# Patient Record
Sex: Female | Born: 1960 | Race: Black or African American | Hispanic: No | Marital: Married | Smoking: Never smoker
Health system: Southern US, Community
[De-identification: ages and names within clinical notes are randomized; demographics above are authoritative.]

## PROBLEM LIST (undated history)

## (undated) DIAGNOSIS — Z87898 Personal history of other specified conditions: Secondary | ICD-10-CM

## (undated) DIAGNOSIS — Z87440 Personal history of urinary (tract) infections: Secondary | ICD-10-CM

## (undated) DIAGNOSIS — D250 Submucous leiomyoma of uterus: Secondary | ICD-10-CM

## (undated) DIAGNOSIS — N952 Postmenopausal atrophic vaginitis: Secondary | ICD-10-CM

## (undated) DIAGNOSIS — K589 Irritable bowel syndrome without diarrhea: Secondary | ICD-10-CM

## (undated) DIAGNOSIS — N3946 Mixed incontinence: Secondary | ICD-10-CM

## (undated) DIAGNOSIS — B379 Candidiasis, unspecified: Secondary | ICD-10-CM

## (undated) DIAGNOSIS — N921 Excessive and frequent menstruation with irregular cycle: Secondary | ICD-10-CM

## (undated) DIAGNOSIS — Z8742 Personal history of other diseases of the female genital tract: Secondary | ICD-10-CM

## (undated) DIAGNOSIS — Z8639 Personal history of other endocrine, nutritional and metabolic disease: Secondary | ICD-10-CM

## (undated) DIAGNOSIS — K219 Gastro-esophageal reflux disease without esophagitis: Secondary | ICD-10-CM

## (undated) DIAGNOSIS — N644 Mastodynia: Secondary | ICD-10-CM

## (undated) DIAGNOSIS — N930 Postcoital and contact bleeding: Secondary | ICD-10-CM

## (undated) DIAGNOSIS — I8390 Asymptomatic varicose veins of unspecified lower extremity: Secondary | ICD-10-CM

## (undated) DIAGNOSIS — Z8669 Personal history of other diseases of the nervous system and sense organs: Secondary | ICD-10-CM

## (undated) HISTORY — DX: Asymptomatic varicose veins of unspecified lower extremity: I83.90

## (undated) HISTORY — DX: Personal history of other diseases of the female genital tract: Z87.42

## (undated) HISTORY — DX: Personal history of other endocrine, nutritional and metabolic disease: Z86.39

## (undated) HISTORY — DX: Candidiasis, unspecified: B37.9

## (undated) HISTORY — DX: Gastro-esophageal reflux disease without esophagitis: K21.9

## (undated) HISTORY — DX: Irritable bowel syndrome, unspecified: K58.9

## (undated) HISTORY — DX: Postcoital and contact bleeding: N93.0

## (undated) HISTORY — DX: Personal history of other specified conditions: Z87.898

## (undated) HISTORY — DX: Personal history of other diseases of the nervous system and sense organs: Z86.69

## (undated) HISTORY — DX: Submucous leiomyoma of uterus: D25.0

## (undated) HISTORY — DX: Postmenopausal atrophic vaginitis: N95.2

## (undated) HISTORY — DX: Mixed incontinence: N39.46

## (undated) HISTORY — PX: OTHER SURGICAL HISTORY: SHX169

## (undated) HISTORY — PX: THYROIDECTOMY, PARTIAL: SHX18

## (undated) HISTORY — DX: Excessive and frequent menstruation with irregular cycle: N92.1

## (undated) HISTORY — DX: Mastodynia: N64.4

## (undated) HISTORY — DX: Personal history of urinary (tract) infections: Z87.440

## (undated) HISTORY — PX: INTRAUTERINE DEVICE INSERTION: SHX323

---

## 1994-06-10 HISTORY — PX: HAMMER TOE SURGERY: SHX385

## 1996-06-10 HISTORY — PX: OTHER SURGICAL HISTORY: SHX169

## 1996-06-10 HISTORY — PX: TUBAL LIGATION: SHX77

## 1998-06-05 ENCOUNTER — Other Ambulatory Visit: Admission: RE | Admit: 1998-06-05 | Discharge: 1998-06-05 | Payer: Self-pay | Admitting: Obstetrics & Gynecology

## 1999-06-11 DIAGNOSIS — N921 Excessive and frequent menstruation with irregular cycle: Secondary | ICD-10-CM

## 1999-06-11 HISTORY — DX: Excessive and frequent menstruation with irregular cycle: N92.1

## 1999-06-15 ENCOUNTER — Other Ambulatory Visit: Admission: RE | Admit: 1999-06-15 | Discharge: 1999-06-15 | Payer: Self-pay | Admitting: Obstetrics and Gynecology

## 1999-06-29 ENCOUNTER — Ambulatory Visit (HOSPITAL_COMMUNITY): Admission: RE | Admit: 1999-06-29 | Discharge: 1999-06-29 | Payer: Self-pay | Admitting: Obstetrics and Gynecology

## 1999-06-29 ENCOUNTER — Encounter: Payer: Self-pay | Admitting: Obstetrics and Gynecology

## 2000-06-16 ENCOUNTER — Other Ambulatory Visit: Admission: RE | Admit: 2000-06-16 | Discharge: 2000-06-16 | Payer: Self-pay | Admitting: Obstetrics and Gynecology

## 2001-06-11 ENCOUNTER — Encounter: Payer: Self-pay | Admitting: Obstetrics and Gynecology

## 2001-06-11 ENCOUNTER — Ambulatory Visit (HOSPITAL_COMMUNITY): Admission: RE | Admit: 2001-06-11 | Discharge: 2001-06-11 | Payer: Self-pay | Admitting: Obstetrics and Gynecology

## 2001-06-18 ENCOUNTER — Other Ambulatory Visit: Admission: RE | Admit: 2001-06-18 | Discharge: 2001-06-18 | Payer: Self-pay | Admitting: Obstetrics and Gynecology

## 2001-09-18 ENCOUNTER — Other Ambulatory Visit: Admission: RE | Admit: 2001-09-18 | Discharge: 2001-09-18 | Payer: Self-pay

## 2001-11-10 ENCOUNTER — Other Ambulatory Visit: Admission: RE | Admit: 2001-11-10 | Discharge: 2001-11-10 | Payer: Self-pay | Admitting: Otolaryngology

## 2002-01-27 ENCOUNTER — Encounter: Payer: Self-pay | Admitting: Otolaryngology

## 2002-01-27 ENCOUNTER — Ambulatory Visit (HOSPITAL_COMMUNITY): Admission: RE | Admit: 2002-01-27 | Discharge: 2002-01-28 | Payer: Self-pay | Admitting: Otolaryngology

## 2002-01-27 ENCOUNTER — Encounter (INDEPENDENT_AMBULATORY_CARE_PROVIDER_SITE_OTHER): Payer: Self-pay | Admitting: *Deleted

## 2002-06-17 ENCOUNTER — Encounter: Payer: Self-pay | Admitting: Obstetrics and Gynecology

## 2002-06-17 ENCOUNTER — Ambulatory Visit (HOSPITAL_COMMUNITY): Admission: RE | Admit: 2002-06-17 | Discharge: 2002-06-17 | Payer: Self-pay | Admitting: Obstetrics and Gynecology

## 2002-06-21 ENCOUNTER — Other Ambulatory Visit: Admission: RE | Admit: 2002-06-21 | Discharge: 2002-06-21 | Payer: Self-pay | Admitting: Obstetrics and Gynecology

## 2003-01-07 ENCOUNTER — Encounter: Admission: RE | Admit: 2003-01-07 | Discharge: 2003-01-07 | Payer: Self-pay | Admitting: Otolaryngology

## 2003-01-07 ENCOUNTER — Encounter: Payer: Self-pay | Admitting: Otolaryngology

## 2003-06-23 ENCOUNTER — Ambulatory Visit (HOSPITAL_COMMUNITY): Admission: RE | Admit: 2003-06-23 | Discharge: 2003-06-23 | Payer: Self-pay | Admitting: Obstetrics and Gynecology

## 2003-06-24 ENCOUNTER — Other Ambulatory Visit: Admission: RE | Admit: 2003-06-24 | Discharge: 2003-06-24 | Payer: Self-pay | Admitting: Obstetrics and Gynecology

## 2004-02-02 ENCOUNTER — Encounter: Admission: RE | Admit: 2004-02-02 | Discharge: 2004-02-02 | Payer: Self-pay | Admitting: Otolaryngology

## 2004-06-25 ENCOUNTER — Other Ambulatory Visit: Admission: RE | Admit: 2004-06-25 | Discharge: 2004-06-25 | Payer: Self-pay | Admitting: Obstetrics and Gynecology

## 2004-07-09 ENCOUNTER — Ambulatory Visit (HOSPITAL_COMMUNITY): Admission: RE | Admit: 2004-07-09 | Discharge: 2004-07-09 | Payer: Self-pay | Admitting: Obstetrics and Gynecology

## 2004-07-09 ENCOUNTER — Ambulatory Visit: Payer: Self-pay | Admitting: Gastroenterology

## 2004-07-16 ENCOUNTER — Ambulatory Visit: Payer: Self-pay | Admitting: Gastroenterology

## 2004-07-30 ENCOUNTER — Ambulatory Visit: Payer: Self-pay | Admitting: Gastroenterology

## 2005-02-04 ENCOUNTER — Encounter: Admission: RE | Admit: 2005-02-04 | Discharge: 2005-02-04 | Payer: Self-pay | Admitting: Otolaryngology

## 2005-07-08 ENCOUNTER — Other Ambulatory Visit: Admission: RE | Admit: 2005-07-08 | Discharge: 2005-07-08 | Payer: Self-pay | Admitting: Obstetrics and Gynecology

## 2005-08-05 ENCOUNTER — Ambulatory Visit (HOSPITAL_COMMUNITY): Admission: RE | Admit: 2005-08-05 | Discharge: 2005-08-05 | Payer: Self-pay | Admitting: Obstetrics and Gynecology

## 2005-12-30 ENCOUNTER — Encounter (INDEPENDENT_AMBULATORY_CARE_PROVIDER_SITE_OTHER): Payer: Self-pay | Admitting: Specialist

## 2005-12-30 ENCOUNTER — Ambulatory Visit (HOSPITAL_COMMUNITY): Admission: RE | Admit: 2005-12-30 | Discharge: 2005-12-30 | Payer: Self-pay | Admitting: Gastroenterology

## 2006-01-06 ENCOUNTER — Ambulatory Visit (HOSPITAL_COMMUNITY): Admission: RE | Admit: 2006-01-06 | Discharge: 2006-01-06 | Payer: Self-pay | Admitting: Gastroenterology

## 2006-02-11 ENCOUNTER — Encounter: Admission: RE | Admit: 2006-02-11 | Discharge: 2006-02-11 | Payer: Self-pay | Admitting: Otolaryngology

## 2006-08-11 ENCOUNTER — Ambulatory Visit (HOSPITAL_COMMUNITY): Admission: RE | Admit: 2006-08-11 | Discharge: 2006-08-11 | Payer: Self-pay | Admitting: Obstetrics and Gynecology

## 2007-02-16 ENCOUNTER — Encounter: Admission: RE | Admit: 2007-02-16 | Discharge: 2007-02-16 | Payer: Self-pay | Admitting: Otolaryngology

## 2007-06-11 DIAGNOSIS — N3946 Mixed incontinence: Secondary | ICD-10-CM

## 2007-06-11 HISTORY — DX: Mixed incontinence: N39.46

## 2007-08-24 ENCOUNTER — Ambulatory Visit (HOSPITAL_COMMUNITY): Admission: RE | Admit: 2007-08-24 | Discharge: 2007-08-24 | Payer: Self-pay | Admitting: Obstetrics and Gynecology

## 2007-12-03 ENCOUNTER — Ambulatory Visit (HOSPITAL_COMMUNITY): Admission: RE | Admit: 2007-12-03 | Discharge: 2007-12-04 | Payer: Self-pay | Admitting: Obstetrics and Gynecology

## 2008-02-29 ENCOUNTER — Encounter: Admission: RE | Admit: 2008-02-29 | Discharge: 2008-02-29 | Payer: Self-pay | Admitting: Otolaryngology

## 2008-06-10 DIAGNOSIS — N644 Mastodynia: Secondary | ICD-10-CM

## 2008-06-10 HISTORY — DX: Mastodynia: N64.4

## 2008-08-25 ENCOUNTER — Ambulatory Visit (HOSPITAL_COMMUNITY): Admission: RE | Admit: 2008-08-25 | Discharge: 2008-08-25 | Payer: Self-pay | Admitting: Obstetrics and Gynecology

## 2009-07-14 ENCOUNTER — Ambulatory Visit (HOSPITAL_COMMUNITY): Admission: RE | Admit: 2009-07-14 | Discharge: 2009-07-14 | Payer: Self-pay | Admitting: Obstetrics and Gynecology

## 2009-07-14 HISTORY — PX: NOVASURE ABLATION: SHX5394

## 2009-07-14 HISTORY — PX: HYSTEROSCOPY: SHX211

## 2009-07-14 HISTORY — PX: DILATION AND CURETTAGE OF UTERUS: SHX78

## 2009-08-29 ENCOUNTER — Ambulatory Visit (HOSPITAL_COMMUNITY): Admission: RE | Admit: 2009-08-29 | Discharge: 2009-08-29 | Payer: Self-pay | Admitting: Obstetrics and Gynecology

## 2010-06-10 DIAGNOSIS — N930 Postcoital and contact bleeding: Secondary | ICD-10-CM

## 2010-06-10 HISTORY — DX: Postcoital and contact bleeding: N93.0

## 2010-08-16 ENCOUNTER — Other Ambulatory Visit (HOSPITAL_COMMUNITY): Payer: Self-pay | Admitting: Obstetrics and Gynecology

## 2010-08-16 DIAGNOSIS — Z1231 Encounter for screening mammogram for malignant neoplasm of breast: Secondary | ICD-10-CM

## 2010-08-29 LAB — CBC
HCT: 34.9 % — ABNORMAL LOW (ref 36.0–46.0)
RDW: 13.6 % (ref 11.5–15.5)

## 2010-09-17 ENCOUNTER — Ambulatory Visit (HOSPITAL_COMMUNITY)
Admission: RE | Admit: 2010-09-17 | Discharge: 2010-09-17 | Disposition: A | Discharge status: Home / Self Care | Payer: 59 | Source: Ambulatory Visit | Attending: Obstetrics and Gynecology | Admitting: Obstetrics and Gynecology

## 2010-09-17 DIAGNOSIS — Z1231 Encounter for screening mammogram for malignant neoplasm of breast: Secondary | ICD-10-CM | POA: Insufficient documentation

## 2010-10-23 NOTE — H&P (Signed)
NAME:  [name], [name] NO.:  [id number]   MEDICAL RECORD NO.:  [id number]          PATIENT TYPE:  AMB   LOCATION:  SDC                           FACILITY:  [facility]   PHYSICIAN:  Osborn Coho, M.D.   DATE OF BIRTH:  Oct 18, 1960   DATE OF ADMISSION:  DATE OF DISCHARGE:                              HISTORY & PHYSICAL   HISTORY OF PRESENT ILLNESS:  [name] is a 50 year old married  African American female para 2-0-1-2 who presents for placement of  tension-free vaginal tape with the possibility of anterior repair and  cystoscopy because of mixed urinary incontinence and cystocele.  For the  past 2 years, the patient had worsening leakage of urine with increased  intra-abdominal pressure especially during work while lifting boxes.  Additionally, the patient has tried to use Kegel exercises to curtail  this occurrence however, it has not been successful.  In 2008, the  patient underwent [facility] Cystometrics for evaluation of her bladder  dysfunction.  She was found to have at that time mixed urinary  incontinence.  The patient denies any dysuria, hematuria, urinary  frequency, dyspareunia or changes in her bowel habits.  The patient was  given both medical and surgical management options for her symptoms to  include observation and for years she chose to observe.  Most recently  however, the patient has decided to proceed with surgical management of  her mixed urinary incontinence with the understanding that placement of  tension-free vaginal tape may only address her stress urinary  incontinence symptoms and medical management may be needed for the urge  incontinence problems.   PAST MEDICAL HISTORY:  Gastroesophageal reflux disease and irritable  bowel syndrome.   OB HISTORY:  Gravida 3, para 2-0-1-2.  The patient had two spontaneous  vaginal births.   GYN HISTORY:  Last menstrual period November 10, 2007.  She denies any  history of sexually transmitted diseases or  abnormal Pap smears.  Last  normal Pap smear and mammogram was March 2009.   SURGICAL HISTORY:  Hammertoe surgery 1996, 1988 right carpal tunnel  repair, 1998 bilateral tubal ligation, and 2003 partial thyroidectomy  for precancerous cells.   FAMILY HISTORY:  Positive for hypertension and depression.   SOCIAL HISTORY:  The patient works for the [employer].  She is married and lives at home with her husband and her children.   HABITS:  She does not consume tobacco, alcohol or illicit drugs.   MEDICATIONS:  Probiotics daily and Prevacid as needed.   ALLERGIES:  She has no known drug allergies and denies any allergy to  shellfish or latex.   REVIEW OF SYSTEMS:  The patient denies any chest pain, shortness of  breath, fever, headache, vision changes, nausea, vomiting or diarrhea.  She does admit to have any recent sinus infection but that appears to  have totally resolved.  Except as is mentioned in history of present  illness, the patient's review of systems is otherwise negative.   PHYSICAL EXAMINATION:  Blood pressure 104/60, weight is 130, height is 5  feet and 7-1/4 inches tall.  PELVIC:  EGBUS is normal.  Vagina is normal; however, the patient does  have a second-degree cystocele.  Cervix is nontender without lesions.  Uterus appears normal size, shape and consistency without tenderness.  Adnexa without tenderness or masses.   IMPRESSION:  1. Mixed urinary incontinence.  2. Cystocele.   DISPOSITION:  A discussion was held with the patient regarding the  indications for her procedures along with the risks which include but  are not limited to reaction to anesthesia, damage to adjacent organs,  infection, excessive bleeding, erosion of tension-free vaginal tape mesh  and worsening of her incontinence symptoms.  The patient verbalized  understanding of these risks and wishes to proceed with placement of  tension-free vaginal tape along with possible anterior  repair and  cystoscopy at [facility] on December 03, 2007, at 9  o'clock a.m.      [place]mira J. Adline Peals.      Osborn Coho, M.D.  Electronically Signed    EJP/MEDQ  D:  11/27/2007  T:  11/27/2007  Job:  [id number]

## 2010-10-23 NOTE — Op Note (Signed)
[name], [name]            ACCOUNT NO.:  [id number]   MEDICAL RECORD NO.:  [id number]          PATIENT TYPE:  OIB   LOCATION:  9309                          [facility]ACILITY:  [facility]   PHYSICIAN:  Osborn Coho, M.D.   DATE OF BIRTH:  [birth year]   DATE OF PROCEDURE:  12/03/2007  DATE OF DISCHARGE:                               OPERATIVE REPORT   PREOPERATIVE DIAGNOSES:  1. Mixed urinary incontinence.  2. Cystocele.  3. Rectocele.   POSTOPERATIVE DIAGNOSES:  1. Mixed urinary incontinence.  2. Cystocele.  3. Rectocele.   PROCEDURE:  1. TVT.  2. Cystoscopy.  3. Anterior repair  4. Posterior repair.   ATTENDING SURGEON:  Osborn Coho, MD.   ASSISTANTMarquis Lunch. Lowell Guitar, [facility]   ANESTHESIA:  General.   FLUIDS:  2000 mL.   URINE OUTPUT:  Quantity sufficient, difficult to measure secondary to  cystoscopy procedures.   ESTIMATED BLOOD LOSS:  Minimal.   COMPLICATIONS:  None.   PROCEDURE IN DETAIL:  The patient was taken to the operating room after  the risks, benefits, and alternatives discussed with the patient.  The  patient verbalized understanding, consent signed, and witnessed.  The  patient was placed under general anesthesia and prepped and draped in  the normal sterile fashion.  The patient was in the dorsal lithotomy  position.  A weighted speculum was placed in the patient's vagina and  two 5-mm incisions were made at the upper edge of the symphysis pubis on  the mons pubis approximately 2 fingerbreadths from the midline  bilaterally.  Attention was then turned to the anterior vaginal wall  where dilute Pitressin was administered.  The anterior vaginal wall was  then incised and the underlying tissue dissected away bilaterally to the  lower edge of the symphysis pubis bilaterally.  The bladder was emptied  completely and the rigid urethral catheter guide placed and the 18-  French Foley in the urethra.  The rigid catheter guide was then  deflected to the  patient's left and the transabdominal guide passed  through the incision on the mons pubis through the space of Retzius and  out through the incision on the anterior vaginal wall.  The same was  done on the contralateral side.  Cystoscopy was then performed and no  inadvertent bladder injury was noted.  The mesh was then attached to the  transabdominal guide and after emptying the bladder completely and  placing the rigid urethral catheter guide vacuuming the urethra and  deflecting to the patient's left, the mesh was elevated up through the  anterior vaginal wall incision through the space of Retzius and out  through the incision on the mons pubis on the ipsilateral side.  The  same was done on the contralateral side.  Cystoscopy was performed once  again and no inadvertent bladder injury was noted and bilateral ureters  were noted to efflux without difficulty.  A large Tresa Endo was then placed  beneath the urethra, between the urethra, the mid urethra, and the mesh  in order to leave the mesh slack beneath the mid urethra.  The sheath  around  the mesh was then removed bilaterally.  The Foley catheter noted  to slide without difficulty and a Tresa Endo was easily placed between the  mesh and the urethra.  Over the cystocele, dilute Pitressin was then  injected into the anterior vaginal wall and an incision made on the  anterior vaginal wall and the underlying tissue dissected away.  The  cystocele was repaired with 2-0 Vicryl via Kelly plication stitches.  The anterior vaginal wall was then repaired with 2-0 Vicryl via a  running interlocking stitch.  Attention was then turned to the posterior  vaginal wall where dilute Pitressin was injected and an incision made  and the underlying tissue dissected away from the posterior vaginal  wall.  The rectocele was then repaired with 2-0 Vicryl via plication  stitches.  The perineal body was reinforced using 0 Vicryl.  The  posterior vaginal wall was then  repaired with 2-0 Vicryl via a running  interlocking stitch.  Bilaterally on the posterior vaginal wall and the  anterior vaginal wall, a total of 1/2 cm of tissue was excised.  This  was done prior to repairing the incisions.  After repair of the  posterior vaginal wall, the vagina was packed with 2-inch plain packing  soaked in estrogen cream.  Sponge, lap, and needle count was correct.  The patient tolerated the procedure well and was returned to recovery  room in good condition.      Osborn Coho, M.D.  Electronically Signed     AR/MEDQ  D:  12/03/2007  T:  12/04/2007  Job:  [id number]

## 2010-10-26 NOTE — Discharge Summary (Signed)
[name], [name]            ACCOUNT NO.:  [id number]   MEDICAL RECORD NO.:  [id number]          PATIENT TYPE:  OIB   LOCATION:  9309                          [facility]   PHYSICIAN:  Osborn Coho, M.D.   DATE OF BIRTH:  1961-05-25   DATE OF ADMISSION:  12/03/2007  DATE OF DISCHARGE:  12/04/2007                               DISCHARGE SUMMARY   DISCHARGE DIAGNOSES:  1. Mixed urinary incontinence, primarily stress urinary incontinence.  2. Cystocele.   OPERATION ON THE DATE OF ADMISSION:  The patient underwent an anterior-  posterior repair, placement of tension-free vaginal tape, and cystoscopy  tolerating procedures well.   HISTORY OF PRESENT ILLNESS:  [name] is a 50 year old married  African American female, para 2-0-1-2, who presents for placement of  tension-free vaginal tape with anterior repair and cystoscopy because of  mixed urinary incontinence, primarily stress urinary incontinence, and  cystocele.  Please see the patient's dictated history and physical  examination for details.   PREOPERATIVE PHYSICAL EXAMINATION:  VITAL SIGNS:  Blood pressure was  106/60, weight was 130, height 5 feet 7-1/4 inches tall.  PELVIC:  EG/BUS was normal.  Vagina was normal, however, the patient did  have a second-degree cystocele.  Her cervix was nontender without  lesions.  Uterus appeared normal size, shape, and consistency without  tenderness and adnexa was without tenderness or masses.   HOSPITAL COURSE:  On the date of admission, the patient underwent the  aforementioned procedures tolerating them all well.  Postop hemoglobin  was 9.4 (preop hemoglobin was 12.0).  By postop day #1, the patient had  resumed bowel and bladder function and was therefore deemed ready for  discharge home.   DISCHARGE MEDICATIONS:  The patient was directed to her home medication  reconciliation form.  She was also advised to take Percocet 5/325 one to  two tablets every 4 hours as needed  for pain, ibuprofen 600 mg with food  every 6 hours for 3 days then as needed for pain, Cipro 250 mg twice  daily for 5 days, Colace 100 mg twice daily until her bowel movements  are regular, and ferrous sulfate 325 mg 1 tablet daily   FOLLOWUP:  The patient is scheduled for 6 weeks postoperative visit with  Dr. Su Hilt on January 14, 2008 at 10 o'clock a.m.   DISCHARGE INSTRUCTIONS:  The patient was advised to call for any  temperature greater than or equal to 100.4 degrees Fahrenheit orally,  severe pain, bleeding, problems with voiding, or vomiting.  The patient  was also advised that if she has not had a bowel movement in 3 days,  that she may take a laxative.  The patient was further advised to  avoid driving for 1 week, heavy lifting (nothing greater than 10 pounds)  for 6 weeks, intercourse for 6 weeks, that she may shower, that she may  walk up steps.  She should keep her incisions clean and dry.  The  patient's diet was without restriction.      Elmira J. Adline Peals.      Osborn Coho, M.D.  Electronically Signed  EJP/MEDQ  D:  12/28/2007  T:  12/28/2007  Job:  [id number]

## 2010-10-26 NOTE — Op Note (Signed)
NAME:  [name], [name]              ACCOUNT NO.:  [id number]   MEDICAL RECORD NO.:  [id number]                   PATIENT TYPE:  OIB   LOCATION:  2550                                 FACILITY:  [facility]   PHYSICIAN:  Dorna Leitz, M.D.                 DATE OF BIRTH:  [birth year]   DATE OF PROCEDURE:  01/27/2002  DATE OF DISCHARGE:                                 OPERATIVE REPORT   PREOPERATIVE DIAGNOSES:  Left thyroid mass.   POSTOPERATIVE DIAGNOSES:  Left thyroid mass.   OPERATION PERFORMED:  Left hemithyroidectomy.   SURGEON:  Dorna Leitz, M.D.   ASSISTANTEnrigue Catena H. Pollyann Kennedy, M.D.   ANESTHESIA:  General endotracheal.   ESTIMATED BLOOD LOSS:  20 cc.   SPECIMENS:  Left thyroid lobe and isthmus.   INDICATIONS FOR PROCEDURE:  The patient is a 50 year old female who  underwent fine needle aspiration of a left thyroid lobe mass initially with  findings of microfollicular pattern with follicular epithelium.  Due to  these findings, a left hemithyroidectomy and isthmusectomy was performed.   OPERATIVE FINDINGS:  The patient was noted to have a less than 1 cm left  superior thyroid lobe nodule.  No other nodules were palpated; however, they  had been noted on ultrasound.  The patient's frozen section was returned as  adenomatous nodule.   DESCRIPTION OF PROCEDURE:  The patient was taken to the operating room and  placed on the table in the supine position.  She was then placed under  general endotracheal anesthesia and the neck gently extended using the  shoulder roll.  The neck was prepped with Betadine and draped in the usual  sterile fashion.  A transverse collar incision was made extending  approximately 6 cm.  This was made with a #15 blade and the subcutaneous  adipose tissue and platysma muscle divided with Bovie cautery.  Subplatysmal  flaps were elevated superiorly and inferiorly.  Self-retaining retractor was  placed.  The strap muscles were divided using  Bovie cautery in the median  raphe and the left strap muscles were elevated off of the thyroid gland.  The superior pole was taken down first adjacent to the gland.  This was  taken down using clamp and tie with 4-0 silk suture.  The recurrent  laryngeal nerve was identified superiorly where it traced laterally.  It was  left in place and protected.  The thyroid gland was completely dissected off  of the underlying fascia and elevated off of the trachea using Bovie  cautery.  The isthmus was taken down along the right side of the trachea.  A  #7 Snyder drain was placed in the depths of the wound and brought out  through the incision.  Strap muscles were reapproximated in a simple  interrupted fashion using 4-0 Vicryl.  The platysma was  closed in a simple interrupted fashion using 4-0 Vicryl and the skin was  closed in a running stitch using  5-0 Prolene.  The drain was secured to the  skin in a simple interrupted fashion using 4-0 nylon.  The patient was  awakened from anesthesia and taken to the post anesthesia care unit in  stable condition.  There were no complications.                                                 Dorna Leitz, M.D.    SLJ/MEDQ  D:  01/27/2002  T:  01/29/2002  Job:  [id number]   cc:   Gregary Signs A. Everardo All, M.D. [facility]   Dr. Phineas Inches

## 2010-10-26 NOTE — Op Note (Signed)
NAME:  [name], [name]            ACCOUNT NO.:  [id number]   MEDICAL RECORD NO.:  [id number]          PATIENT TYPE:  AMB   LOCATION:  ENDO                         FACILITY:  [facility]   PHYSICIAN:  Anselmo Rod, M.D.  DATE OF BIRTH:  31-Dec-1960   DATE OF PROCEDURE:  DATE OF DISCHARGE:                                 OPERATIVE REPORT   PROCEDURE:  Esophagogastroduodenoscopy.   ENDOSCOPIST:  Anselmo Rod.   INSTRUMENT USED:  Olympus video panedoscope.   INDICATIONS FOR PROCEDURE:  Patient ahs a history of epigastric pain along  with a longstanding history of reflux; rule out peptic ulcer disease,  Barrett's mucosa, etc.   DESCRIPTION OF PROCEDURE:  The patient was placed in the left lateral  decubitus position, sedated with 50 mcg of fentanyl and 5 mg of Versed in  incremental doses.  Once the patient was adequately sedated, maintained on  oxygen, continued static monitoring the Olympus video panendoscope was  advanced with the mouthpiece over the tongue and into the esophagus under  direct vision.  The entire esophagus appeared normal with no evidence of  rings, strictures, masses, esophagitis or Barrett's mucosa.  The scope was  advanced into the stomach.  A large amount of debris was seen along the  dependent areas of the stomach.  No ulcer, erosions, masses or polyps are  identified.  The mucosa in the proximal stomach could not be visualized  because of a large amount of debris.  The proximal small bowel appeared  normal.  There was no evidence of hiatal hernia seen on high retroflexion.  The patient tolerated the procedure well without immediate complications.   IMPRESSION:  1.Normal appearing esophagus.  2.Large amount of debris in the stomach.  3.Normal external proximal small bowel.  4.Mild diffuse gastritis.  No ulcers, masses, erosions, etc., seen.   RECOMMENDATIONS:  1.Patient has been advised to continue PPI.  2.Gastric emptying study is scheduled has been  scheduled for 01/06/2006.  3.Outpatient follow up after the gastric emptying study has been done.      Anselmo Rod, M.D.  Electronically Signed     JNM/MEDQ  D:  12/30/2005  T:  12/31/2005  Job:  [id number]

## 2011-03-07 LAB — CBC
HCT: 27.8 — ABNORMAL LOW
HCT: 36
Hemoglobin: 12
Hemoglobin: 9.4 — ABNORMAL LOW
MCHC: 33.7
MCV: 85.1
Platelets: 205
RBC: 3.24 — ABNORMAL LOW
RBC: 4.22
WBC: 5.5

## 2011-03-07 LAB — BASIC METABOLIC PANEL
BUN: 2 — ABNORMAL LOW
Calcium: 8.5
Chloride: 106
Creatinine, Ser: 0.81
GFR calc Af Amer: >60
Glucose, Bld: 118 — ABNORMAL HIGH
Potassium: 3.6

## 2011-03-07 LAB — HCG, SERUM, QUALITATIVE: Preg, Serum: NEGATIVE

## 2011-07-24 DIAGNOSIS — Z889 Allergy status to unspecified drugs, medicaments and biological substances status: Secondary | ICD-10-CM | POA: Insufficient documentation

## 2011-08-05 ENCOUNTER — Other Ambulatory Visit: Payer: Self-pay | Admitting: Obstetrics and Gynecology

## 2011-08-05 DIAGNOSIS — Z1231 Encounter for screening mammogram for malignant neoplasm of breast: Secondary | ICD-10-CM

## 2011-09-23 ENCOUNTER — Ambulatory Visit (HOSPITAL_COMMUNITY)
Admission: RE | Admit: 2011-09-23 | Discharge: 2011-09-23 | Disposition: A | Discharge status: Home / Self Care | Payer: 59 | Source: Ambulatory Visit | Attending: Obstetrics and Gynecology | Admitting: Obstetrics and Gynecology

## 2011-09-23 ENCOUNTER — Encounter: Payer: Self-pay | Admitting: Obstetrics and Gynecology

## 2011-09-23 ENCOUNTER — Ambulatory Visit (INDEPENDENT_AMBULATORY_CARE_PROVIDER_SITE_OTHER): Payer: 59 | Admitting: Obstetrics and Gynecology

## 2011-09-23 VITALS — BP 110/64 | HR 62 | Ht 67.5 in | Wt 124.0 lb

## 2011-09-23 DIAGNOSIS — Z9009 Acquired absence of other part of head and neck: Secondary | ICD-10-CM

## 2011-09-23 DIAGNOSIS — Z9889 Other specified postprocedural states: Secondary | ICD-10-CM

## 2011-09-23 DIAGNOSIS — N951 Menopausal and female climacteric states: Secondary | ICD-10-CM

## 2011-09-23 DIAGNOSIS — D259 Leiomyoma of uterus, unspecified: Secondary | ICD-10-CM

## 2011-09-23 DIAGNOSIS — D219 Benign neoplasm of connective and other soft tissue, unspecified: Secondary | ICD-10-CM

## 2011-09-23 DIAGNOSIS — Z124 Encounter for screening for malignant neoplasm of cervix: Secondary | ICD-10-CM

## 2011-09-23 DIAGNOSIS — Z975 Presence of (intrauterine) contraceptive device: Secondary | ICD-10-CM

## 2011-09-23 DIAGNOSIS — Z1231 Encounter for screening mammogram for malignant neoplasm of breast: Secondary | ICD-10-CM | POA: Insufficient documentation

## 2011-09-23 DIAGNOSIS — IMO0001 Reserved for inherently not codable concepts without codable children: Secondary | ICD-10-CM | POA: Insufficient documentation

## 2011-09-23 LAB — FOLLICLE STIMULATING HORMONE: FSH: 136.5 m[IU]/mL — ABNORMAL HIGH

## 2011-09-23 LAB — THYROID PANEL WITH TSH
Free Thyroxine Index: 2.2 (ref 1.0–3.9)
TSH: 3.016 u[IU]/mL (ref 0.350–4.500)

## 2011-09-23 NOTE — Progress Notes (Signed)
Subjective:    [name] is a 51 y.o. female [phone] who presents for annual exam. Patient has a submucosal fibroid and is S/P ablation that is having more frequent post coital bleeding without pain over the past several months as well as without intercourse.  Initially it was only with intercourse.   Now has to wear a panty liner x 3 days with the bleeding.   Patient also reports increased hot flashes.  The patient  History  Sexual Activity  . Sexually Active: Yes -- Female partner(s)  . Birth Control/ Protection: Other-see comments    BTL  . The patient is not taking hormone replacement therapy and is taking a Calcium supplement. Patient denies post-menopausal vaginal bleeding. patient has bleeding as previously described. She reports  History  Smoking status  . Never Smoker   Smokeless tobacco  . Not on file   and  History  Alcohol Use No  .  Last Pap: was normal  2012 Last mammogram: was normal  2012   Review of Systems Pertinent items are noted in HPI. Gastrointestinal:No change in bowel habits, no abdominal pain, no rectal bleeding Genitourinary:negative for dysuria, frequency, hematuria, nocturia and urinary incontinence    Objective:     BP 110/64  Pulse 62  Ht 5' 7.5" (1.715 m)  Wt 124 lb (56.246 kg)  BMI 19.13 kg/m2 Weight:  Wt Readings from Last 1 Encounters:  09/23/11 124 lb (56.246 kg)   BMI: Body mass index is 19.13 kg/(m^2). General Appearance: Alert, appropriate appearance for age. No acute distress HEENT: Grossly normal Neck / Thyroid: Supple, no masses, nodes or enlargement Lungs: clear to auscultation bilaterally Back: No CVA tenderness Breast Exam: No masses or nodes.No dimpling, nipple retraction or discharge. Cardiovascular: Regular rate and rhythm. S1, S2, no murmur Gastrointestinal: Soft, non-tender, no masses or organomegaly Pelvic Exam: EGBUS atrophic, vagina atrophic, cervix slightly stenotic uterus NSSC without tenderness,  adnexae- no masses or tenderness Rectovaginal: normal rectal, no masses Lymphatic Exam: Non-palpable nodes in neck, clavicular, axillary, or inguinal regions Skin: no rash or abnormalities Extremities: No cyanosis, No clubbing and No edema  negative Homan's Neurologic: Grossly normal Psychiatric: Alert and oriented, appropriate affect.    Urinalysis:Not done      Assessment:    Annual GYN Exam  Menopausal Sx Vaginal Bleeding S/P partial thyroidectomy Submucosal fibroid S/P endometrial ablation   Plan:    Educational material distributed.  Reviewed herbal, hormonal and misc.options for managing menopausal sx.  Pt. wants to try herbal. Information given on Estrovera (rhubarb root)   To discuss with Dr. Su Hilt further f/u for vaginal bleeding  Thyroid Panel with TSH

## 2011-09-24 ENCOUNTER — Telehealth: Payer: Self-pay | Admitting: Obstetrics and Gynecology

## 2011-09-24 NOTE — Telephone Encounter (Signed)
Call to patient, per Dr. Su Hilt, schedule f/u with her to discuss options for mngmt of bleeding (obs., Mirena or hyst)

## 2011-09-24 NOTE — Telephone Encounter (Signed)
Message copied by Henreitta Leber on Tue Sep 24, 2011  8:41 AM ------      Message from: Osborn Coho      Created: Tue Sep 24, 2011  7:53 AM      Regarding: RE: Recommended Evaluation/Management       You may need to have them set up a f/u appt with me.  Her TSH was nl and FSH suggests menopause or perimenopause... so I would need to discuss options with her if it is bothering her enough...ie. Continued obs vs mirena vs hysterectomy.            Thanks,      AYR            ----- Message -----         From: Henreitta Leber, PA         Sent: 09/23/2011   9:44 PM           To: Purcell Nails, MD      Subject: Recommended Evaluation/Management                        Dr. Su Hilt,            You performed an endometrial ablation on this lady who has submucosal fibroid (less than 2 cm at the time of [facility]).  Initially, she only had bleeding with intercourse but now for past 3 months it occurs at other times also , lasts 3 days, and requires 3-4 pads a day.            TSH & FSH are pending.   Please advise.   Thanks, E

## 2011-09-25 LAB — PAP IG W/ RFLX HPV ASCU

## 2011-10-02 ENCOUNTER — Telehealth: Payer: Self-pay | Admitting: Obstetrics and Gynecology

## 2011-10-02 NOTE — Telephone Encounter (Signed)
PT RTND CALL, STATES IS HAVING PAIN AND BLEEDING WITH URINATION, THINKS HAS BLADDER INFECTION, REQUESTS APPT.  PT SCHED APPT WITH SR TOMORROW 10/03/11 @ 1530 FOR EVAL.

## 2011-10-02 NOTE — Telephone Encounter (Signed)
TC TO PT REGARDING MSG WANTS TO BEEN FOR A BLADDER INFECTION TODAY.  LM ON VM TO CALL BACK

## 2011-10-02 NOTE — Telephone Encounter (Signed)
Routed to triage 

## 2011-10-03 ENCOUNTER — Ambulatory Visit (INDEPENDENT_AMBULATORY_CARE_PROVIDER_SITE_OTHER): Payer: 59 | Admitting: Obstetrics and Gynecology

## 2011-10-03 ENCOUNTER — Encounter: Payer: Self-pay | Admitting: Obstetrics and Gynecology

## 2011-10-03 VITALS — BP 102/62 | Wt 127.0 lb

## 2011-10-03 DIAGNOSIS — N390 Urinary tract infection, site not specified: Secondary | ICD-10-CM

## 2011-10-03 DIAGNOSIS — N949 Unspecified condition associated with female genital organs and menstrual cycle: Secondary | ICD-10-CM

## 2011-10-03 DIAGNOSIS — R102 Pelvic and perineal pain: Secondary | ICD-10-CM

## 2011-10-03 LAB — POCT URINALYSIS DIPSTICK
Blood, UA: 2
Nitrite, UA: NEGATIVE
Urobilinogen, UA: NEGATIVE
pH, UA: 8

## 2011-10-03 MED ORDER — NYSTATIN-TRIAMCINOLONE 100000-0.1 UNIT/GM-% EX OINT: TOPICAL_OINTMENT | Freq: Three times a day (TID) | CUTANEOUS | Status: AC | PRN

## 2011-10-03 MED ORDER — CIPROFLOXACIN HCL 500 MG PO TABS: 500.0000 mg | ORAL_TABLET | Freq: Two times a day (BID) | ORAL | Status: AC

## 2011-10-03 NOTE — Progress Notes (Signed)
Contraception: Btl/ Ablation                                                                                                                               History of STD:  no history of PID, STD's History of ovarian cyst: no History of fibroids: yes:   History of endometriosis:no Previous ultrasound:2012  Urinary symptoms: urinary frequency Gastro-intestinal symptoms:  Constipation: no     Diarrhea: yes     Nausea: no     Vomiting: no     Fever: no Vaginal discharge: no vaginal discharge  S: for 2 days C/O burning with urination with hematuria. Frequency and with constant RLQ pain. No back pain No fever     Similar to previous UTI. No cycles since EM ablation 1 year ago  O: Vulva: mild erythema      Vagina: normal      Cervix: normal      Uterus / adnexa: normal  U/A: min leukocytes  A: Probable UTI  P: urine to culture      Cipro x 3 days      Follow-up PRN

## 2012-01-03 ENCOUNTER — Encounter: Payer: Self-pay | Admitting: Obstetrics and Gynecology

## 2012-09-21 ENCOUNTER — Other Ambulatory Visit: Payer: Self-pay

## 2012-09-21 DIAGNOSIS — Z1231 Encounter for screening mammogram for malignant neoplasm of breast: Secondary | ICD-10-CM

## 2012-10-12 ENCOUNTER — Ambulatory Visit
Admission: RE | Admit: 2012-10-12 | Discharge: 2012-10-12 | Disposition: A | Discharge status: Home / Self Care | Payer: 59 | Source: Ambulatory Visit

## 2012-10-12 DIAGNOSIS — Z1231 Encounter for screening mammogram for malignant neoplasm of breast: Secondary | ICD-10-CM

## 2012-10-14 ENCOUNTER — Other Ambulatory Visit: Payer: Self-pay | Admitting: Obstetrics and Gynecology

## 2012-10-14 DIAGNOSIS — R928 Other abnormal and inconclusive findings on diagnostic imaging of breast: Secondary | ICD-10-CM

## 2012-11-03 ENCOUNTER — Ambulatory Visit
Admission: RE | Admit: 2012-11-03 | Discharge: 2012-11-03 | Disposition: A | Discharge status: Home / Self Care | Payer: 59 | Source: Ambulatory Visit | Attending: Obstetrics and Gynecology | Admitting: Obstetrics and Gynecology

## 2012-11-03 DIAGNOSIS — R928 Other abnormal and inconclusive findings on diagnostic imaging of breast: Secondary | ICD-10-CM

## 2013-09-20 ENCOUNTER — Other Ambulatory Visit: Payer: Self-pay

## 2013-09-20 DIAGNOSIS — Z1231 Encounter for screening mammogram for malignant neoplasm of breast: Secondary | ICD-10-CM

## 2013-09-30 ENCOUNTER — Other Ambulatory Visit: Payer: Self-pay | Admitting: *Deleted

## 2013-09-30 DIAGNOSIS — I83893 Varicose veins of bilateral lower extremities with other complications: Secondary | ICD-10-CM

## 2013-10-01 ENCOUNTER — Encounter: Payer: Self-pay | Admitting: Surgery

## 2013-10-04 ENCOUNTER — Ambulatory Visit (INDEPENDENT_AMBULATORY_CARE_PROVIDER_SITE_OTHER): Payer: 59 | Admitting: Surgery

## 2013-10-04 ENCOUNTER — Ambulatory Visit (HOSPITAL_COMMUNITY)
Admission: RE | Admit: 2013-10-04 | Discharge: 2013-10-04 | Disposition: A | Discharge status: Home / Self Care | Payer: 59 | Source: Ambulatory Visit | Attending: Surgery | Admitting: Surgery

## 2013-10-04 ENCOUNTER — Encounter: Payer: Self-pay | Admitting: Surgery

## 2013-10-04 VITALS — BP 107/66 | HR 75 | Resp 14 | Ht 68.0 in | Wt 123.0 lb

## 2013-10-04 DIAGNOSIS — I83893 Varicose veins of bilateral lower extremities with other complications: Secondary | ICD-10-CM

## 2013-10-04 NOTE — Progress Notes (Signed)
Patient name: [name] MRN: [id number] DOB: 1960/06/15 Sex: female   Referred by: Dr. Nancy Fetter  Reason for referral:  Chief Complaint  Patient presents with  . New Evaluation    bilateral varicose veins    HISTORY OF PRESENT ILLNESS:  this is a very pleasant 53 year old female who is referred for further evaluation of bilateral swelling in her legs with prominent veins.  The patient states that she has been having trouble for many years.  It appears to be getting worse.  She points to several veins that are tender, particularly around her knee.  She doesn't endorse swelling especially when she is on her feet which happens on most days.  She has worn tight socks but never compression stockings.  She denies any bleeding areas.  The patient is also treated for gastroesophageal reflux disease as well as various GYN issues.  Past Medical History  Diagnosis Date  . Breakthrough bleeding 2001  . Hx of thyroid nodule   . H/O fatigue   . Hx: UTI (urinary tract infection)   . Yeast infection   . Urinary incontinence, mixed 2009  . H/O menorrhagia   . H/O carpal tunnel syndrome   . Irritable bowel disease   . GERD (gastroesophageal reflux disease)   . Mastalgia 2010  . Postcoital bleeding 2012  . Vaginal atrophy   . Fibroids, submucosal   . Varicose veins     Past Surgical History  Procedure Laterality Date  .  s/p bilateral tubal ligation  1998  . Thyroidectomy, partial    . Novasure ablation  07/14/2009  . Dilation and curettage of uterus  07/14/2009  . Hysteroscopy  07/14/2009  . Tubal ligation  1998    bilateral  . Hammer toe surgery  1996  . Carpal tunnel repair  right  . Intrauterine device insertion      History   Social History  . Marital Status: Married    Spouse Name: N/A    Number of Children: N/A  . Years of Education: N/A   Occupational History  . Not on file.   Social History Main Topics  . Smoking status: Never Smoker   . Smokeless tobacco:  Never Used  . Alcohol Use: No  . Drug Use: No  . Sexual Activity: Yes    Partners: Male    Birth Control/ Protection: Other-see comments     Comment: BTL   Other Topics Concern  . Not on file   Social History Narrative  . No narrative on file    Family History  Problem Relation Age of Onset  . Hypertension Mother   . Hypertension Sister   . Hypertension Brother   . Hypertension Sister   . Hypertension Brother     Allergies as of 10/04/2013 - Review Complete 10/04/2013  Allergen Reaction Noted  . Cholestatin Itching and Other (See Comments) 09/23/2011    Current Outpatient Prescriptions on File Prior to Visit  Medication Sig Dispense Refill  . Ascorbic Acid (VITAMIN C) 100 MG tablet Take 100 mg by mouth daily.      . Biotin 10 MG TABS Take by mouth.      . cetirizine (ZYRTEC) 10 MG tablet Take 10 mg by mouth daily.      . Cranberry 500 MG CAPS Take by mouth.      . ferrous sulfate 325 (65 FE) MG tablet Take 325 mg by mouth daily with breakfast.      . Multiple Vitamin (  MULTIVITAMIN) tablet Take 1 tablet by mouth daily.      . Docosahexaenoic Acid (DHA OMEGA 3 PO) Take by mouth.      . fish oil-omega-3 fatty acids 1000 MG capsule Take 2 g by mouth daily.      . vitamin E 200 UNIT capsule Take 200 Units by mouth daily.       No current facility-administered medications on file prior to visit.     REVIEW OF SYSTEMS: Cardiovascular: No chest pain, chest pressure, palpitations, orthopnea, or dyspnea on exertion. No claudication or rest pain,  positive for pain in legs and walking and leg swelling.. Pulmonary: No productive cough, asthma or wheezing. Neurologic: No weakness, paresthesias, aphasia, or amaurosis. No dizziness. Hematologic: No bleeding problems or clotting disorders. Musculoskeletal: No joint pain or joint swelling. Gastrointestinal: No blood in stool or hematemesis Genitourinary: No dysuria or hematuria. Psychiatric:: No history of major  depression. Integumentary: No rashes or ulcers. Constitutional: No fever or chills.  PHYSICAL EXAMINATION: General: The patient appears their stated age.  Vital signs are BP 107/66  Pulse 75  Resp 14  Ht 5\' 8"  (1.727 m)  Wt 123 lb (55.792 kg)  BMI 18.71 kg/m2 HEENT:  No gross abnormalities Pulmonary: Respirations are non-labored Musculoskeletal: There are no major deformities.   Neurologic: No focal weakness or paresthesias are detected, Skin: There are no ulcer or rashes noted. Psychiatric: The patient has normal affect. Cardiovascular: There is a regular rate and rhythm without significant murmur appreciated.  Palpable pedal pulses bilaterally.  Multiple spider/reticular veins and bilateral thighs and calves.  Diagnostic Studies: Ultrasound was ordered and reviewed.  This shows no evidence of deep vein thrombosis bilaterally.  There is mild deep vein reflux on the right and mild deep vein reflux on the left.  There is no superficial vein reflux.   Assessment:  Venous insufficiency Plan: The patient's showed no evidence of superficial vein reflux today.  She does have mild deep system reflux bilaterally.  On physical exam she mainly has spider veins and reticular veins.  Some are more prominent than others.  I discussed that she would benefit from wearing 20-30 thigh-high compression stockings given her deep vein reflux.  She is not a candidate for laser ablation given that she does not have evidence of superficial system reflux.  She would be a candidate for sclerotherapy of her spider and reticular veins.  She will be contacted by Kathlee Nations later this week or next week regarding getting this scheduled and payment options.     Eldridge Abrahams, M.D. [facility] Office: [phone] Pager:  [phone]

## 2013-10-18 ENCOUNTER — Ambulatory Visit
Admission: RE | Admit: 2013-10-18 | Discharge: 2013-10-18 | Disposition: A | Discharge status: Home / Self Care | Payer: 59 | Source: Ambulatory Visit

## 2013-10-18 DIAGNOSIS — Z1231 Encounter for screening mammogram for malignant neoplasm of breast: Secondary | ICD-10-CM

## 2013-10-27 ENCOUNTER — Ambulatory Visit: Payer: 59 | Admitting: *Deleted

## 2014-04-11 ENCOUNTER — Encounter: Payer: Self-pay | Admitting: Surgery

## 2014-08-15 ENCOUNTER — Other Ambulatory Visit: Payer: Self-pay

## 2014-08-15 DIAGNOSIS — Z1231 Encounter for screening mammogram for malignant neoplasm of breast: Secondary | ICD-10-CM

## 2014-08-30 ENCOUNTER — Encounter: Payer: Self-pay | Admitting: *Deleted

## 2014-08-31 ENCOUNTER — Ambulatory Visit (INDEPENDENT_AMBULATORY_CARE_PROVIDER_SITE_OTHER): Payer: 59 | Admitting: *Deleted

## 2014-08-31 DIAGNOSIS — I781 Nevus, non-neoplastic: Secondary | ICD-10-CM

## 2014-08-31 DIAGNOSIS — I8393 Asymptomatic varicose veins of bilateral lower extremities: Secondary | ICD-10-CM

## 2014-08-31 NOTE — Progress Notes (Signed)
X=.3% Sotradecol administered with a 27g butterfly.  Patient received a total of 12cc.  Treated majority of her problematic/painful spider veins. She will need more sclero in the future. Unable to get 100% with just two syringes. Easy access. Tol well. Will foloow prn.  Photos: No.  Compression stockings applied: Yes.

## 2014-10-24 ENCOUNTER — Ambulatory Visit: Payer: 59

## 2014-10-31 ENCOUNTER — Ambulatory Visit
Admission: RE | Admit: 2014-10-31 | Discharge: 2014-10-31 | Disposition: A | Discharge status: Home / Self Care | Payer: 59 | Source: Ambulatory Visit

## 2014-10-31 DIAGNOSIS — Z1231 Encounter for screening mammogram for malignant neoplasm of breast: Secondary | ICD-10-CM

## 2015-03-29 ENCOUNTER — Ambulatory Visit: Payer: 59 | Admitting: *Deleted

## 2015-03-29 ENCOUNTER — Ambulatory Visit: Payer: 59

## 2015-04-17 ENCOUNTER — Encounter: Payer: Self-pay | Admitting: *Deleted

## 2015-04-19 ENCOUNTER — Ambulatory Visit (INDEPENDENT_AMBULATORY_CARE_PROVIDER_SITE_OTHER): Payer: 59 | Admitting: *Deleted

## 2015-04-19 ENCOUNTER — Encounter: Payer: Self-pay | Admitting: *Deleted

## 2015-04-19 ENCOUNTER — Encounter: Payer: Self-pay | Admitting: Vascular Surgery

## 2015-04-19 DIAGNOSIS — I8393 Asymptomatic varicose veins of bilateral lower extremities: Secondary | ICD-10-CM

## 2015-04-19 NOTE — Progress Notes (Signed)
X=.3% Sotradecol administered with a 27g butterfly.  Patient received a total of 12cc.  Pt is happy since her last treatment even though she has had some staining. Cleaned up remaining spiders and some reticulars. Easy access. Tol well. Anticipate good results for this nice lady. Will follow prn.  Photos: No.  Compression stockings applied: Yes.

## 2016-11-23 DIAGNOSIS — E890 Postprocedural hypothyroidism: Secondary | ICD-10-CM | POA: Insufficient documentation

## 2017-02-11 ENCOUNTER — Other Ambulatory Visit: Payer: Self-pay | Admitting: Gastroenterology

## 2017-02-11 DIAGNOSIS — R112 Nausea with vomiting, unspecified: Secondary | ICD-10-CM

## 2017-02-11 NOTE — Progress Notes (Signed)
Sherri Borrelli MD 

## 2017-02-24 ENCOUNTER — Ambulatory Visit (HOSPITAL_COMMUNITY)
Admission: RE | Admit: 2017-02-24 | Discharge: 2017-02-24 | Disposition: A | Discharge status: Home / Self Care | Payer: POS | Source: Ambulatory Visit | Attending: Gastroenterology | Admitting: Gastroenterology

## 2017-02-24 DIAGNOSIS — K769 Liver disease, unspecified: Secondary | ICD-10-CM | POA: Insufficient documentation

## 2017-02-24 DIAGNOSIS — R112 Nausea with vomiting, unspecified: Secondary | ICD-10-CM | POA: Insufficient documentation

## 2017-02-24 MED ORDER — TECHNETIUM TC 99M SULFUR COLLOID
2.0000 | Freq: Once | INTRAVENOUS | Status: AC | PRN
  Administered 2017-02-24: 2 via ORAL

## 2017-02-25 ENCOUNTER — Other Ambulatory Visit: Payer: Self-pay | Admitting: Gastroenterology

## 2017-02-25 DIAGNOSIS — K769 Liver disease, unspecified: Secondary | ICD-10-CM

## 2017-02-25 NOTE — Progress Notes (Signed)
Sherri Diles MD 

## 2017-03-20 ENCOUNTER — Other Ambulatory Visit: Payer: 59

## 2017-03-30 ENCOUNTER — Ambulatory Visit
Admission: RE | Admit: 2017-03-30 | Discharge: 2017-03-30 | Disposition: A | Discharge status: Home / Self Care | Payer: POS | Source: Ambulatory Visit | Attending: Gastroenterology | Admitting: Gastroenterology

## 2017-03-30 DIAGNOSIS — K769 Liver disease, unspecified: Secondary | ICD-10-CM

## 2017-03-30 MED ORDER — GADOBENATE DIMEGLUMINE 529 MG/ML IV SOLN
10.0000 mL | Freq: Once | INTRAVENOUS | Status: AC | PRN
  Administered 2017-03-30: 10 mL via INTRAVENOUS

## 2017-03-31 DIAGNOSIS — D1803 Hemangioma of intra-abdominal structures: Secondary | ICD-10-CM | POA: Insufficient documentation

## 2017-05-05 ENCOUNTER — Telehealth (INDEPENDENT_AMBULATORY_CARE_PROVIDER_SITE_OTHER): Payer: Self-pay | Admitting: Orthopaedic Surgery

## 2017-05-05 NOTE — Telephone Encounter (Signed)
IC and LMVM to call me to discuss note.

## 2017-05-05 NOTE — Telephone Encounter (Signed)
[name] talked to Dr. Lorin Mercy (he told her to call) she was out with her mother last week for her surgery and she was needing a note to go back to work today. CB # [phone] She would like to pick it up.

## 2017-05-05 NOTE — Telephone Encounter (Signed)
Yes OK for note thanks 

## 2017-05-05 NOTE — Telephone Encounter (Signed)
Is this ok to do?  Did not want to do without approval straight from you.

## 2017-05-06 ENCOUNTER — Telehealth (INDEPENDENT_AMBULATORY_CARE_PROVIDER_SITE_OTHER): Payer: Self-pay | Admitting: Orthopaedic Surgery

## 2017-05-06 NOTE — Telephone Encounter (Signed)
[name] talked to Dr. Lorin Mercy (he told her to call) she was out with her mother last week for her surgery and she was needing a note to go back to work today. CB # [id number] She would like to pick it up.  Her mother's name is Oneal Deputy, and she's needing a note for last Monday/Tuesday and Wednesday. She picked a note up yesterday that was incorrect and was asking for another one to be typed up for her to pick up today. If you would give her a call back when ready.

## 2017-05-06 NOTE — Telephone Encounter (Signed)
IC Maleigha, this has been now corrected, new letter ready for her to pickup, message should have been taken in Oneal Deputy' chart. Letter is in Oneal Deputy' chart.

## 2017-11-27 DIAGNOSIS — N952 Postmenopausal atrophic vaginitis: Secondary | ICD-10-CM | POA: Insufficient documentation

## 2019-07-13 ENCOUNTER — Ambulatory Visit (HOSPITAL_COMMUNITY)
Admission: EM | Admit: 2019-07-13 | Discharge: 2019-07-13 | Disposition: A | Discharge status: Home / Self Care | Payer: POS | Attending: Family Medicine | Admitting: Family Medicine

## 2019-07-13 ENCOUNTER — Other Ambulatory Visit: Payer: Self-pay

## 2019-07-13 ENCOUNTER — Encounter (HOSPITAL_COMMUNITY): Payer: Self-pay

## 2019-07-13 DIAGNOSIS — R350 Frequency of micturition: Secondary | ICD-10-CM | POA: Insufficient documentation

## 2019-07-13 DIAGNOSIS — R309 Painful micturition, unspecified: Secondary | ICD-10-CM | POA: Diagnosis present

## 2019-07-13 DIAGNOSIS — R32 Unspecified urinary incontinence: Secondary | ICD-10-CM | POA: Insufficient documentation

## 2019-07-13 LAB — POCT URINALYSIS DIP (DEVICE)
Bilirubin Urine: NEGATIVE
Glucose, UA: NEGATIVE mg/dL
Hgb urine dipstick: NEGATIVE
Ketones, ur: NEGATIVE mg/dL
Leukocytes,Ua: NEGATIVE
Nitrite: NEGATIVE
Protein, ur: NEGATIVE mg/dL
Specific Gravity, Urine: 1.01 (ref 1.005–1.030)
Urobilinogen, UA: 0.2 mg/dL (ref 0.0–1.0)
pH: 7 (ref 5.0–8.0)

## 2019-07-13 NOTE — Discharge Instructions (Signed)
We have sent a urine culture, if this results anything we will notify you and send in treatment  Try the Azo to see if this aids your symptoms  Follow up with your Urologist next week  If you develop worsening symptoms, fever, chills, please follow up with this clinic, your primary care or Urologist.

## 2019-07-13 NOTE — ED Provider Notes (Signed)
[place]    CSN: [id number] Arrival date & time: 07/13/19  1129      History   Chief Complaint Chief Complaint  Patient presents with  . Urinary Tract Infection    HPI [name] is a 59 y.o. female.   Patient with history of recurrent UTI, repaired cystocele, presents with 2-3 days of urinary frequency, burning and recent increase in incontinence symptoms. She denies blood in urine. She endorses not feeling as though she is completely emptying her bladder. Denies abdominal pain. Denies fever and chills or low back pain.   She notes covid has stopped her from following up with the Urologist who performed her surgery. She reports an appointment next Friday with that urologist.      Past Medical History:  Diagnosis Date  . Breakthrough bleeding 2001  . Fibroids, submucosal   . GERD (gastroesophageal reflux disease)   . H/O carpal tunnel syndrome   . H/O fatigue   . H/O menorrhagia   . Hx of thyroid nodule   . Hx: UTI (urinary tract infection)   . Irritable bowel disease   . Mastalgia 2010  . Postcoital bleeding 2012  . Urinary incontinence, mixed 2009  . Vaginal atrophy   . Varicose veins   . Yeast infection     Patient Active Problem List   Diagnosis Date Noted  . Varicose veins of lower extremities with other complications [id number]  . Fibroids 09/23/2011  . S/P endometrial ablation 09/23/2011  . IUD 09/23/2011  . H/O bladder repair surgery 09/23/2011    Past Surgical History:  Procedure Laterality Date  .  S/P bilateral tubal ligation  1998  . carpal tunnel repair  right  . DILATION AND CURETTAGE OF UTERUS  07/14/2009  . [address]  . HYSTEROSCOPY  07/14/2009  . INTRAUTERINE DEVICE INSERTION    . [place]  07/14/2009  . THYROIDECTOMY, PARTIAL    . TUBAL LIGATION  1998   bilateral    OB History    Gravida  3   Para  2   Term      Preterm      AB  1   Living  2     SAB      TAB      Ectopic     Multiple      Live Births               Home Medications    Prior to Admission medications   Medication Sig Start Date End Date Taking? Authorizing Provider  Ascorbic Acid (VITAMIN C) 100 MG tablet Take 100 mg by mouth daily.    [provider]  Biotin 10 MG TABS Take by mouth.    [provider]  cetirizine (ZYRTEC) 10 MG tablet Take 10 mg by mouth daily.    [provider]  Cranberry 500 MG CAPS Take by mouth.    [provider]  Docosahexaenoic Acid (DHA OMEGA 3 PO) Take by mouth.    [provider]  esomeprazole (NEXIUM) 40 MG capsule Take 40 mg by mouth as needed.    [provider]  ferrous sulfate 325 (65 FE) MG tablet Take 325 mg by mouth daily with breakfast.    [provider]  fish oil-omega-3 fatty acids 1000 MG capsule Take 2 g by mouth daily.    [provider]  FLAXSEED, LINSEED, PO Take by mouth.    [provider]  Multiple Vitamin (MULTIVITAMIN) tablet Take 1 tablet by mouth daily.    [provider]  Probiotic Product (PROBIOTIC DAILY PO) Take by mouth.    [provider]  vitamin E 200 UNIT capsule Take 200 Units by mouth daily.    [provider]    Family History Family History  Problem Relation Age of Onset  . Hypertension Mother   . Hypertension Sister   . Hypertension Brother   . Hypertension Sister   . Hypertension Brother     Social History Social History   Tobacco Use  . Smoking status: Never Smoker  . Smokeless tobacco: Never Used  Substance Use Topics  . Alcohol use: No  . Drug use: No     Allergies   Cholestatin   Review of Systems Review of Systems  Constitutional: Negative for chills and fever.  Gastrointestinal: Negative for abdominal pain, diarrhea and nausea.  Genitourinary: Positive for dysuria, frequency and urgency. Negative for decreased urine volume, hematuria, pelvic pain, vaginal bleeding, vaginal discharge  and vaginal pain.  Musculoskeletal: Negative for myalgias.  Neurological: Negative for dizziness.     Physical Exam Triage Vital Signs ED Triage Vitals  Enc Vitals Group     BP 07/13/19 1202 117/79     Pulse Rate 07/13/19 1202 68     Resp 07/13/19 1202 18     Temp 07/13/19 1202 98.3 F (36.8 C)     Temp Source 07/13/19 1202 Oral     SpO2 07/13/19 1202 100 %     Weight --      Height --      Head Circumference --      Peak Flow --      Pain Score 07/13/19 1204 5     Pain Loc --      Pain Edu? --      Excl. in [place]? --    No data found.  Updated Vital Signs BP 117/79 (BP Location: Right Arm)   Pulse 68   Temp 98.3 F (36.8 C) (Oral)   Resp 18   SpO2 100%   Visual Acuity Right Eye Distance:   Left Eye Distance:   Bilateral Distance:    Right Eye Near:   Left Eye Near:    Bilateral Near:     Physical Exam Vitals and nursing note reviewed.  Constitutional:      General: She is not in acute distress.    Appearance: Normal appearance. She is well-developed. She is not ill-appearing.  HENT:     Head: Normocephalic and atraumatic.  Eyes:     General: No scleral icterus.    Conjunctiva/sclera: Conjunctivae normal.  Cardiovascular:     Rate and Rhythm: Normal rate.  Pulmonary:     Effort: Pulmonary effort is normal. No respiratory distress.  Abdominal:     Palpations: Abdomen is soft.     Tenderness: There is no abdominal tenderness. There is no right CVA tenderness or left CVA tenderness.  Musculoskeletal:     Cervical back: Neck supple.     Right lower leg: No edema.     Left lower leg: No edema.  Skin:    General: Skin is warm and dry.  Neurological:     General: No focal deficit present.     Mental Status: She is alert and oriented to person, place, and time.  Psychiatric:        Mood and Affect: Mood normal.        Behavior: Behavior normal.  Thought Content: Thought content normal.        Judgment: Judgment normal.      UC Treatments /  Results  Labs (all labs ordered are listed, but only abnormal results are displayed) Labs Reviewed  URINE CULTURE  POCT URINALYSIS DIP (DEVICE)    EKG   Radiology No results found.  Procedures Procedures (including critical care time)  Medications Ordered in UC Medications - No data to display  Initial Impression / Assessment and Plan / UC Course  I have reviewed the triage vital signs and the nursing notes.  Pertinent labs & imaging results that were available during my care of the patient were reviewed by me and considered in my medical decision making (see chart for details).     #Urinary Symptoms - UA completely normal, culture sent. History of cystocele, given recent symptoms, consider recurrence. Near term follow up. AZO for painful urination. Instructed if she has worsening symptoms to follow up with urgent care, PCP or urology.    Final Clinical Impressions(s) / UC Diagnoses   Final diagnoses:  Urinary frequency  Painful urination  Urinary incontinence, unspecified type     Discharge Instructions     We have sent a urine culture, if this results anything we will notify you and send in treatment  Try the Azo to see if this aids your symptoms  Follow up with your Urologist next week  If you develop worsening symptoms, fever, chills, please follow up with this clinic, your primary care or Urologist.      ED Prescriptions    None     PDMP not reviewed this encounter.   Purnell Shoemaker, PA-C 07/13/19 1453

## 2019-07-13 NOTE — ED Triage Notes (Signed)
Pt presents with urinary tract symptoms; pt states she has burning during urination, vaginal irritation, urinary frequency, and urgency.  Pt states she has frequent UTIs and was seeing a urologist prior to covid.

## 2019-07-15 ENCOUNTER — Telehealth (HOSPITAL_COMMUNITY): Payer: Self-pay | Admitting: Physician Assistant

## 2019-07-15 DIAGNOSIS — N3090 Cystitis, unspecified without hematuria: Secondary | ICD-10-CM

## 2019-07-15 LAB — URINE CULTURE: Culture: >=100000 — AB

## 2019-07-15 MED ORDER — NITROFURANTOIN MONOHYD MACRO 100 MG PO CAPS: 100.0000 mg | ORAL_CAPSULE | Freq: Two times a day (BID) | ORAL | 0 refills | Status: AC

## 2019-07-15 NOTE — Telephone Encounter (Signed)
Patient called and notified that urine culture grew e. Faecalis. She reports she is no longer experiencing symptoms and has scheduled follow up with her urologist in the next 1-2 weeks. Discussed that susceptibilities to macrobid were present and that I would send in macrobid 100mg  2 times a day for 5 days. She mentioned issues with this not resolving UTI's in the past and we discussed that based on the susceptibilities reported this would be the best medication to start with. Instructed to complete course of macrobid and follow up with Urologist, PCP or urgent care if symptoms persist or return.

## 2019-12-07 DIAGNOSIS — L089 Local infection of the skin and subcutaneous tissue, unspecified: Secondary | ICD-10-CM | POA: Insufficient documentation

## 2019-12-07 DIAGNOSIS — L658 Other specified nonscarring hair loss: Secondary | ICD-10-CM | POA: Insufficient documentation

## 2020-09-27 ENCOUNTER — Ambulatory Visit (INDEPENDENT_AMBULATORY_CARE_PROVIDER_SITE_OTHER): Payer: POS

## 2020-09-27 ENCOUNTER — Ambulatory Visit (HOSPITAL_COMMUNITY)
Admission: EM | Admit: 2020-09-27 | Discharge: 2020-09-27 | Disposition: A | Discharge status: Home / Self Care | Payer: POS | Attending: Emergency Medicine | Admitting: Emergency Medicine

## 2020-09-27 ENCOUNTER — Encounter (HOSPITAL_COMMUNITY): Payer: Self-pay | Admitting: Emergency Medicine

## 2020-09-27 ENCOUNTER — Other Ambulatory Visit: Payer: Self-pay

## 2020-09-27 DIAGNOSIS — S99922A Unspecified injury of left foot, initial encounter: Secondary | ICD-10-CM | POA: Diagnosis not present

## 2020-09-27 DIAGNOSIS — M7989 Other specified soft tissue disorders: Secondary | ICD-10-CM | POA: Diagnosis not present

## 2020-09-27 DIAGNOSIS — S92412A Displaced fracture of proximal phalanx of left great toe, initial encounter for closed fracture: Secondary | ICD-10-CM

## 2020-09-27 DIAGNOSIS — M79675 Pain in left toe(s): Secondary | ICD-10-CM | POA: Diagnosis not present

## 2020-09-27 NOTE — Discharge Instructions (Addendum)
Do not put any weight on your left foot until you have been seen by orthopedics or podiatry.    Keep cast dry.  Rest.  You can apply ice for 10-15 minutes every 4-6 hours as needed.    Follow up with orthopedics or podiatry as soon as possible.    Return or go to the Emergency Department if symptoms worsen or do not improve in the next few days.

## 2020-09-27 NOTE — ED Provider Notes (Signed)
[place]    CSN: [id number] Arrival date & time: 09/27/20  1030      History   Chief Complaint Chief Complaint  Patient presents with  . Toe Injury    HPI [name] is a 60 y.o. female.   Patient with left great toe pain, bruising, and swelling that happened after hitting her toe Monday night.  Reports pain worse with movement.  Has not taken any OTC medication but has used ice for relief.  Pain is constant and aching in nature.  Denies any fevers, chest pain, shortness of breath, N/V/D, numbness, tingling, weakness, abdominal pain, or headaches.   ROS: As per HPI, all other pertinent ROS negative    The history is provided by the patient.    Past Medical History:  Diagnosis Date  . Breakthrough bleeding 2001  . Fibroids, submucosal   . GERD (gastroesophageal reflux disease)   . H/O carpal tunnel syndrome   . H/O fatigue   . H/O menorrhagia   . Hx of thyroid nodule   . Hx: UTI (urinary tract infection)   . Irritable bowel disease   . Mastalgia 2010  . Postcoital bleeding 2012  . Urinary incontinence, mixed 2009  . Vaginal atrophy   . Varicose veins   . Yeast infection     Patient Active Problem List   Diagnosis Date Noted  . Varicose veins of lower extremities with other complications 41/32/4401  . Fibroids 09/23/2011  . S/P endometrial ablation 09/23/2011  . IUD 09/23/2011  . H/O bladder repair surgery 09/23/2011    Past Surgical History:  Procedure Laterality Date  .  S/P bilateral tubal ligation  1998  . carpal tunnel repair  right  . DILATION AND CURETTAGE OF UTERUS  07/14/2009  . [place]  . HYSTEROSCOPY  07/14/2009  . INTRAUTERINE DEVICE INSERTION    . [place]  07/14/2009  . THYROIDECTOMY, PARTIAL    . TUBAL LIGATION  1998   bilateral    OB History    Gravida  3   Para  2   Term      Preterm      AB  1   Living  2     SAB      IAB      Ectopic      Multiple      Live Births                Home Medications    Prior to Admission medications   Medication Sig Start Date End Date Taking? Authorizing Provider  Ascorbic Acid (VITAMIN C) 100 MG tablet Take 100 mg by mouth daily.   Yes [provider]  Biotin 10 MG TABS Take by mouth.   Yes [provider]  cetirizine (ZYRTEC) 10 MG tablet Take 10 mg by mouth daily.   Yes [provider]  Cranberry 500 MG CAPS Take by mouth.   Yes [provider]  Docosahexaenoic Acid (DHA OMEGA 3 PO) Take by mouth.   Yes [provider]  esomeprazole (NEXIUM) 40 MG capsule Take 40 mg by mouth as needed.   Yes [provider]  ferrous sulfate 325 (65 FE) MG tablet Take 325 mg by mouth daily with breakfast.   Yes [provider]  fish oil-omega-3 fatty acids 1000 MG capsule Take 2 g by mouth daily.   Yes [provider]  FLAXSEED, LINSEED, PO Take by mouth.   Yes  [provider]  Multiple Vitamin (MULTIVITAMIN) tablet Take 1 tablet by mouth daily.   Yes [provider]  Probiotic Product (PROBIOTIC DAILY PO) Take by mouth.   Yes [provider]  vitamin E 200 UNIT capsule Take 200 Units by mouth daily.   Yes [provider]    Family History Family History  Problem Relation Age of Onset  . Hypertension Mother   . Hypertension Sister   . Hypertension Brother   . Hypertension Sister   . Hypertension Brother     Social History Social History   Tobacco Use  . Smoking status: Never Smoker  . Smokeless tobacco: Never Used  Substance Use Topics  . Alcohol use: No  . Drug use: No     Allergies   Cholestatin and Codeine   Review of Systems Review of Systems  Musculoskeletal: Positive for arthralgias and joint swelling.  All other systems reviewed and are negative.    Physical Exam Triage Vital Signs ED Triage Vitals  Enc Vitals Group     BP 09/27/20 1051 (!) 101/50     Pulse Rate 09/27/20 1051 73     Resp  --      Temp 09/27/20 1051 98.3 F (36.8 C)     Temp Source 09/27/20 1051 Oral     SpO2 09/27/20 1051 100 %     Weight --      Height --      Head Circumference --      Peak Flow --      Pain Score 09/27/20 1053 7     Pain Loc --      Pain Edu? --      Excl. in [place]? --    No data found.  Updated Vital Signs BP (!) 101/50 (BP Location: Left Arm)   Pulse 73   Temp 98.3 F (36.8 C) (Oral)   SpO2 100%   Visual Acuity Right Eye Distance:   Left Eye Distance:   Bilateral Distance:    Right Eye Near:   Left Eye Near:    Bilateral Near:     Physical Exam Vitals and nursing note reviewed.  Constitutional:      General: She is not in acute distress.    Appearance: Normal appearance. She is not ill-appearing, toxic-appearing or diaphoretic.  HENT:     Head: Normocephalic and atraumatic.  Eyes:     Conjunctiva/sclera: Conjunctivae normal.  Cardiovascular:     Rate and Rhythm: Normal rate.     Pulses: Normal pulses.  Pulmonary:     Effort: Pulmonary effort is normal.  Abdominal:     General: Abdomen is flat.  Musculoskeletal:     Cervical back: Normal range of motion.     Right foot: Normal.     Left foot: Decreased range of motion. Normal capillary refill. Swelling (left great toe), tenderness and bony tenderness present. No laceration. Normal pulse.  Skin:    General: Skin is warm and dry.  Neurological:     General: No focal deficit present.     Mental Status: She is alert and oriented to person, place, and time.  Psychiatric:        Mood and Affect: Mood normal.      UC Treatments / Results  Labs (all labs ordered are listed, but only abnormal results are displayed) Labs Reviewed - No data to display  EKG   Radiology DG Toe Great Left  Result Date: 09/27/2020 CLINICAL DATA:  Great toe  swelling and bruising. EXAM: LEFT GREAT TOE COMPARISON:  No prior. FINDINGS: Minimally displaced fracture of the base of the proximal phalanx of the left great toe noted.  The fracture extends into the metatarsal phalangeal joint space. No radiopaque foreign body. IMPRESSION: Minimally displaced fracture of the base of the proximal phalanx of left great toe noted. The fracture extends into the metatarsal phalangeal joint space. Electronically Signed   By: Marcello Moores  Register   On: 09/27/2020 11:35    Procedures Procedures (including critical care time)  Medications Ordered in UC Medications - No data to display  Initial Impression / Assessment and Plan / UC Course  I have reviewed the triage vital signs and the nursing notes.  Pertinent labs & imaging results that were available during my care of the patient were reviewed by me and considered in my medical decision making (see chart for details).     Assessment negative for red flags or concerns.  X-ray shows minimally displaced fracture of the proximal phalanx of the left great toe that extends into the metatarsal pharyngeal joint space.  Posterior short leg splint applied in office.  Patient given crutches and and is nonweightbearing until she can be evaluated by podiatry or orthopedics.  Patient does report having an orthopedic doctor that she is seen in the past and will get reestablished with them.  Patient discussed using NSAIDs for pain relief as well as rest and RICE.  Work note given for the next few days.  Follow-up with Ortho as soon as possible.  Return or go to the emergency room for any worsening symptoms.  Final Clinical Impressions(s) / UC Diagnoses   Final diagnoses:  Closed displaced fracture of proximal phalanx of left great toe, initial encounter  Great toe pain, left  Injury of left great toe, initial encounter     Discharge Instructions     Do not put any weight on your left foot until you have been seen by orthopedics or podiatry.    Keep cast dry.  Rest.  You can apply ice for 10-15 minutes every 4-6 hours as needed.    Follow up with orthopedics or podiatry as soon as possible.     Return or go to the Emergency Department if symptoms worsen or do not improve in the next few days.       ED Prescriptions    None     PDMP not reviewed this encounter.   Pearson Forster, NP 09/27/20 1220

## 2020-09-27 NOTE — ED Triage Notes (Signed)
Patient states that she was playing with her son and kicked him on Monday night.  Now she is having left big toe pain and swelling.  Applied ice.  No OTC meds.

## 2021-03-23 ENCOUNTER — Other Ambulatory Visit: Payer: Self-pay

## 2021-03-23 ENCOUNTER — Ambulatory Visit
Admission: EM | Admit: 2021-03-23 | Discharge: 2021-03-23 | Disposition: A | Discharge status: Home / Self Care | Payer: POS | Attending: Physician Assistant | Admitting: Physician Assistant

## 2021-03-23 ENCOUNTER — Encounter: Payer: Self-pay | Admitting: Emergency Medicine

## 2021-03-23 DIAGNOSIS — N390 Urinary tract infection, site not specified: Secondary | ICD-10-CM | POA: Diagnosis not present

## 2021-03-23 LAB — POCT URINALYSIS DIP (MANUAL ENTRY)
Bilirubin, UA: NEGATIVE
Glucose, UA: NEGATIVE mg/dL
Ketones, POC UA: NEGATIVE mg/dL
Nitrite, UA: NEGATIVE
Protein Ur, POC: NEGATIVE mg/dL
Spec Grav, UA: 1.015 (ref 1.010–1.025)
Urobilinogen, UA: 0.2 E.U./dL
pH, UA: 7 (ref 5.0–8.0)

## 2021-03-23 MED ORDER — NITROFURANTOIN MONOHYD MACRO 100 MG PO CAPS: 100.0000 mg | ORAL_CAPSULE | Freq: Two times a day (BID) | ORAL | 0 refills | Status: DC

## 2021-03-23 NOTE — ED Triage Notes (Signed)
Being followed by urology for frequent UTIs. Believes she has another UTI. Reports burning and urinary frequency.

## 2021-03-23 NOTE — ED Provider Notes (Signed)
[facility]    CSN: [id number] Arrival date & time: 03/23/21  1034      History   Chief Complaint Chief Complaint  Patient presents with   Dysuria    HPI [name] is a 60 y.o. female.   Patient here today for evaluation of possible UTI.  She reports that last night she started to have dysuria as well as urinary frequency.  She has history of recurrent UTIs and is seeing urology for same.  She denies any fever or chills.  She denies any abdominal pain or back pain.  She has not had any nausea or vomiting.  She does not report any treatment for symptoms.   Dysuria Associated symptoms: no abdominal pain, no fever, no nausea and no vomiting    Past Medical History:  Diagnosis Date   Breakthrough bleeding 2001   Fibroids, submucosal    GERD (gastroesophageal reflux disease)    H/O carpal tunnel syndrome    H/O fatigue    H/O menorrhagia    Hx of thyroid nodule    Hx: UTI (urinary tract infection)    Irritable bowel disease    Mastalgia 2010   Postcoital bleeding 2012   Urinary incontinence, mixed 2009   Vaginal atrophy    Varicose veins    Yeast infection     Patient Active Problem List   Diagnosis Date Noted   Varicose veins of lower extremities with other complications 03/26/5101   Fibroids 09/23/2011   S/P endometrial ablation 09/23/2011   IUD 09/23/2011   H/O bladder repair surgery 09/23/2011    Past Surgical History:  Procedure Laterality Date    S/P bilateral tubal ligation  1998   carpal tunnel repair  right   DILATION AND CURETTAGE OF UTERUS  07/14/2009   HAMMER TOE SURGERY  1996   HYSTEROSCOPY  07/14/2009   INTRAUTERINE DEVICE INSERTION     NOVASURE ABLATION  07/14/2009   THYROIDECTOMY, PARTIAL     TUBAL LIGATION  1998   bilateral    OB History     Gravida  3   Para  2   Term      Preterm      AB  1   Living  2      SAB      IAB      Ectopic      Multiple      Live Births               Home  Medications    Prior to Admission medications   Medication Sig Start Date End Date Taking? Authorizing Provider  nitrofurantoin, macrocrystal-monohydrate, (MACROBID) 100 MG capsule Take 1 capsule (100 mg total) by mouth 2 (two) times daily. 03/23/21  Yes Francene Finders, PA-C  Ascorbic Acid (VITAMIN C) 100 MG tablet Take 100 mg by mouth daily.    [provider]  Biotin 10 MG TABS Take by mouth.    [provider]  cetirizine (ZYRTEC) 10 MG tablet Take 10 mg by mouth daily.    [provider]  Cranberry 500 MG CAPS Take by mouth.    [provider]  Docosahexaenoic Acid (DHA OMEGA 3 PO) Take by mouth.    [provider]  esomeprazole (NEXIUM) 40 MG capsule Take 40 mg by mouth as needed.    [provider]  ferrous sulfate 325 (65 FE) MG tablet Take 325 mg by mouth daily with breakfast.    [provider]  fish oil-omega-3 fatty acids 1000 MG capsule Take 2 g by mouth daily.    [provider]  FLAXSEED, LINSEED, PO Take by mouth.    [provider]  Multiple Vitamin (MULTIVITAMIN) tablet Take 1 tablet by mouth daily.    [provider]  Probiotic Product (PROBIOTIC DAILY PO) Take by mouth.    [provider]  vitamin E 200 UNIT capsule Take 200 Units by mouth daily.    [provider]    Family History Family History  Problem Relation Age of Onset   Hypertension Mother    Hypertension Sister    Hypertension Brother    Hypertension Sister    Hypertension Brother     Social History Social History   Tobacco Use   Smoking status: Never   Smokeless tobacco: Never  Substance Use Topics   Alcohol use: No   Drug use: No     Allergies   Cholestatin and Codeine   Review of Systems Review of Systems  Constitutional:  Negative for chills and fever.  Respiratory:  Negative for shortness of breath.   Gastrointestinal:  Negative for abdominal pain, nausea and vomiting.   Genitourinary:  Positive for dysuria and frequency.  Musculoskeletal:  Negative for back pain.    Physical Exam Triage Vital Signs ED Triage Vitals  Enc Vitals Group     BP 03/23/21 1127 120/71     Pulse Rate 03/23/21 1127 78     Resp 03/23/21 1127 16     Temp 03/23/21 1127 98.2 F (36.8 C)     Temp Source 03/23/21 1127 Oral     SpO2 03/23/21 1127 98 %     Weight --      Height --      Head Circumference --      Peak Flow --      Pain Score 03/23/21 1131 6     Pain Loc --      Pain Edu? --      Excl. in [place]? --    No data found.  Updated Vital Signs BP 120/71 (BP Location: Right Arm)   Pulse 78   Temp 98.2 F (36.8 C) (Oral)   Resp 16   SpO2 98%     Physical Exam Vitals and nursing note reviewed.  Constitutional:      General: She is not in acute distress.    Appearance: Normal appearance. She is not ill-appearing.  HENT:     Head: Normocephalic and atraumatic.     Nose: Nose normal.  Cardiovascular:     Rate and Rhythm: Normal rate and regular rhythm.     Heart sounds: Normal heart sounds. No murmur heard. Pulmonary:     Effort: Pulmonary effort is normal. No respiratory distress.     Breath sounds: Normal breath sounds. No wheezing, rhonchi or rales.  Abdominal:     General: Abdomen is flat.     Tenderness: There is no right CVA tenderness or left CVA tenderness.  Skin:    General: Skin is warm and dry.  Neurological:     Mental Status: She is alert.  Psychiatric:        Mood and Affect: Mood normal.        Thought Content: Thought content normal.     UC Treatments / Results  Labs (all labs ordered are listed, but only abnormal results are displayed) Labs Reviewed  POCT URINALYSIS DIP (MANUAL ENTRY) - Abnormal; Notable for the following components:  Result Value   Clarity, UA hazy (*)    Blood, UA moderate (*)    Leukocytes, UA Moderate (2+) (*)    All other components within normal limits  URINE CULTURE    EKG   Radiology No  results found.  Procedures Procedures (including critical care time)  Medications Ordered in UC Medications - No data to display  Initial Impression / Assessment and Plan / UC Course  I have reviewed the triage vital signs and the nursing notes.  Pertinent labs & imaging results that were available during my care of the patient were reviewed by me and considered in my medical decision making (see chart for details).  Macrobid prescribed for suspected UTI and urine culture ordered.  Recommended follow-up if symptoms fail to improve or worsen anyway.  Final Clinical Impressions(s) / UC Diagnoses   Final diagnoses:  Recurrent UTI     Discharge Instructions      Take antibiotic as prescribed. Follow up with any further concerns.      ED Prescriptions     Medication Sig Dispense Auth. Provider   nitrofurantoin, macrocrystal-monohydrate, (MACROBID) 100 MG capsule Take 1 capsule (100 mg total) by mouth 2 (two) times daily. 10 capsule Francene Finders, PA-C      PDMP not reviewed this encounter.   Francene Finders, PA-C 03/23/21 1157

## 2021-03-23 NOTE — Discharge Instructions (Addendum)
Take antibiotic as prescribed. Follow up with any further concerns.

## 2021-03-25 LAB — URINE CULTURE: Culture: 70000 — AB

## 2021-05-01 ENCOUNTER — Other Ambulatory Visit: Payer: Self-pay

## 2021-05-01 ENCOUNTER — Ambulatory Visit: Payer: POS | Attending: Obstetrics and Gynecology | Admitting: Physical Therapy

## 2021-05-01 ENCOUNTER — Encounter: Payer: Self-pay | Admitting: Physical Therapy

## 2021-05-01 DIAGNOSIS — M6281 Muscle weakness (generalized): Secondary | ICD-10-CM | POA: Diagnosis present

## 2021-05-01 DIAGNOSIS — R279 Unspecified lack of coordination: Secondary | ICD-10-CM | POA: Insufficient documentation

## 2021-05-01 NOTE — Therapy (Signed)
[facility] @ [address] [place], [place], [address] Phone: [phone]   Fax:  [phone]  Physical Therapy Treatment  Patient Details  Name: [name] MRN: [id number] Date of Birth: [birth year] Referring Provider (PT): Donnel Saxon, [place]   Encounter Date: 05/01/2021   PT End of Session - 05/01/21 1015     Visit Number 1    Date for PT Re-Evaluation 08/29/21    Authorization Type [employer]    PT Start Time 0930    PT Stop Time 1012    PT Time Calculation (min) 42 min    Activity Tolerance Patient tolerated treatment well    Behavior During Therapy [facility] for tasks assessed/performed             Past Medical History:  Diagnosis Date   Breakthrough bleeding 2001   Fibroids, submucosal    GERD (gastroesophageal reflux disease)    H/O carpal tunnel syndrome    H/O fatigue    H/O menorrhagia    Hx of thyroid nodule    Hx: UTI (urinary tract infection)    Irritable bowel disease    Mastalgia 2010   Postcoital bleeding 2012   Urinary incontinence, mixed 2009   Vaginal atrophy    Varicose veins    Yeast infection     Past Surgical History:  Procedure Laterality Date    S/P bilateral tubal ligation  1998   carpal tunnel repair  right   DILATION AND CURETTAGE OF UTERUS  07/14/2009   HAMMER TOE SURGERY  1996   HYSTEROSCOPY  07/14/2009   INTRAUTERINE DEVICE INSERTION     NOVASURE ABLATION  07/14/2009   THYROIDECTOMY, PARTIAL     TUBAL LIGATION  1998   bilateral    There were no vitals filed for this visit.   Subjective Assessment - 05/01/21 0934     Subjective Pt reports she has had chronic h/o with recent worsening of urinary incontience, increased frequency in urination, frequent UTIs, feels like she is urinating every 10-15 mins. Usually gets up 2-3x per night. Pt does have leakage sometimes post bladder voids, walking, lifting. Has BMs once per day without leakage needing 3-4x times. Pt reports while  working for [employer] if she lifts at work after [employer] she will notice some stool when she wipes again.    Pertinent History ~2003 pt had bladder lifting surgery per pt    How long can you sit comfortably? no limits    How long can you stand comfortably? no limits    How long can you walk comfortably? no limits    Patient Stated Goals to have less leakage    Currently in Pain? No/denies                [facility] PT Assessment - 05/01/21 0001       Assessment   Medical Diagnosis R32 (ICD-10-CM) - Unspecified urinary incontinence    Referring Provider (PT) Donnel Saxon, CNM    Onset Date/Surgical Date --   a few years   Prior Therapy not for PFPT      Precautions   Precautions None      Restrictions   Weight Bearing Restrictions No      Balance Screen   Has the patient fallen in the past 6 months No    Has the patient had a decrease in activity level because of a fear of falling?  No    Is the patient reluctant to  leave their home because of a fear of falling?  No      Home Ecologist residence    Living Arrangements Spouse/significant other;Children      Prior Function   Level of Independence Independent    Vocation Full time employment    Vocation Requirements postal services      Cognition   Overall Cognitive Status Within Functional Limits for tasks assessed      Sensation   Light Touch Appears Intact      Coordination   Gross Motor Movements are Fluid and Coordinated Yes    Fine Motor Movements are Fluid and Coordinated Yes      Posture/Postural Control   Posture/Postural Control No significant limitations      ROM / Strength   AROM / PROM / Strength AROM;Strength      AROM   Overall AROM Comments thoracic and lumbar limited in rotation and side bending by 25% all other [facility]      Strength   Overall Strength Comments bil hip flexion 5/5, all other directions 4/5      Flexibility   Soft Tissue Assessment /Muscle Length yes    bil adductors limited by 25%     Palpation   Palpation comment no TTP noted, mild decreased mobility at fascia in abdomen           No emotional/communication barriers or cognitive limitation. Patient is motivated to learn. Patient understands and agrees with treatment goals and plan. PT explains patient will be examined in standing, sitting, and lying down to see how their muscles and joints work. When they are ready, they will be asked to remove their underwear so PT can examine their perineum. The patient is also given the option of providing their own chaperone as one is not provided in our facility. The patient also has the right and is explained the right to defer or refuse any part of the evaluation or treatment including the internal exam. With the patient's consent, PT will use one gloved finger to gently assess the muscles of the pelvic floor, seeing how well it contracts and relaxes and if there is muscle symmetry. After, the patient will get dressed and PT and patient will discuss exam findings and plan of care. PT and patient discuss plan of care, schedule, attendance policy and HEP activities.             Pelvic Floor Special Questions - 05/01/21 0001     Prior Pelvic/Prostate Exam Yes   normal   Are you Pregnant or attempting pregnancy? No    Prior Pregnancies Yes    Number of Pregnancies 2    Number of Vaginal Deliveries 2    Any difficulty with labor and deliveries Yes   tearing with both needing stitches, unsure of grade   Episiotomy Performed No    Currently Sexually Active No   does reports dryness, irritation   History of sexually transmitted disease No    Marinoff Scale discomfort that does not affect completion    Urinary Leakage Yes    How often daily a few times per day    Pad use no    Activities that cause leaking With strong urge;Lifting;Bending;Walking;Exercising    Urinary urgency Yes    Urinary frequency yes- pt reports sometimes she gets urge to  urinate every 10-15 mins but unable to leave work that often and holds it for 30 mins. 3-4x per night.Pt reports at work she  squats to urinate due to restroom cleanliness.    Fecal incontinence Yes   sometimes post BM once clean, if she lifts at work will have stool present when wiping again later. Sometimes will have small BM with urination and unware she needed to or did until in toliet.   Fluid intake ~35oz per day of water    Caffeine beverages tea once per day    Falling out feeling (prolapse) No    Pelvic Floor Internal Exam patient identified and patient confirms consent for PT to perform internal soft tissue work and muscle strength and integrity assessment    Exam Type Vaginal    Sensation WFL    Palpation WFL, NO TTP    Strength --   0/5, pt unable to demonstate contraction   Strength # of reps 0    Strength # of seconds 0    Tone decreased                        PT Education - 05/01/21 1014     Education Details Pt educated on exam findings, POC, bladder irritants and sitting to urinate instead of squating    Person(s) Educated Patient    Methods Explanation;Demonstration;Tactile cues;Verbal cues;Handout    Comprehension Verbalized understanding;Returned demonstration              PT Short Term Goals - 05/01/21 1028       PT SHORT TERM GOAL #1   Title Pt to be I with HEP    Time 5    Period Weeks    Status New    Target Date 06/05/21      PT SHORT TERM GOAL #2   Title pt to report ability to hold urine for 1 hour with no more than 1 leak between voids.    Time 5    Period Weeks    Status New    Target Date 06/05/21      PT SHORT TERM GOAL #3   Title pt to demonstrate at least 2/5 pelvic floor strength to decrease leakage symptoms    Baseline 0/5    Time 5    Period Weeks    Status New    Target Date 06/05/21      PT SHORT TERM GOAL #4   Title pt to recall good voiding mechanics and breathing techniques to fully void urine and stool when  needed.    Time 5    Period Weeks    Status New    Target Date 06/05/21               PT Long Term Goals - 05/01/21 1030       PT LONG TERM GOAL #1   Title Pt to be I with advanced HEP    Time 4    Period Months    Status New    Target Date 08/29/21      PT LONG TERM GOAL #2   Title pt to report ability to hold urine for 3 hours with no more than 1 leak between voids to improve QOL.    Time 4    Period Months    Status New    Target Date 08/29/21      PT LONG TERM GOAL #3   Title pt to demonstrate at least 4/5 pelvic floor strength to decrease leakage symptoms    Time 4    Period Months    Status New  Target Date 08/29/21      PT LONG TERM GOAL #4   Title pt to demonstrate improved coordination with pelvic floor contract/relax and breathing mechanics with activity at least 75% of the time to decrease strain and leakage at pelvic floor.    Time 4    Period Months    Status New    Target Date 08/29/21                   Plan - 05/01/21 1015     Clinical Impression Statement Pt is 60 yo female, presenting to clinic with years long h/o of urinary frequency, leakage, and urgency but has worsened recently. Pt also now having instances of post BM leakage with lifting at work, after [facility] and pt returns to work if she lifts something and returns to bathroom will have stool leakage present when wiping. Pt reports she has urge to urinate every 10-15 mins but is unable to leave job this often so is able to hold it for 30 mins. Pt will have intermittent urinary leakage with lifting, walking, strong urge and does not wear pads, and is woken to urinate at least 3-4x per night. Pt found to have mild decreased mobility at spine and hips,decreased strength at bil hips, and mild fascial restrictions in abdomen and at bladder. Pt consented to internal vaginal assessment, demonstrated inability to contract pelvic floor despite max VC, quick release, and extra time. Pt tolerated  well without pain throughout. Pt later in assessment reported she squats to urinate each time at work instead of sitting due to hygiene at work, educated to use wipes as needed to clean seat and or toliet paper to cover seat to relax pelvic floor to fully void. Pt agreed to attempt and motivated to improve. Pt would benefit from additional PT to further address needs.    Personal Factors and Comorbidities Comorbidity 1    Comorbidities x2 vaginal births    Examination-Activity Limitations Continence;Hygiene/Grooming;Toileting;Lift;Locomotion Level    Examination-Participation Restrictions Community Activity;Occupation    Stability/Clinical Decision Making Stable/Uncomplicated    Clinical Decision Making Low    Rehab Potential Good    PT Frequency 1x / week    PT Duration Other (comment)   10 weeks   PT Treatment/Interventions ADLs/Self Care Home Management;Aquatic Therapy;Functional mobility training;Therapeutic activities;Therapeutic exercise;Neuromuscular re-education;Manual techniques;Patient/family education;Scar mobilization;Passive range of motion;Energy conservation    PT Next Visit Plan go over bladder diary, bladder retraining, pelvic contractions    PT Home Exercise Plan bladder irritants    Consulted and Agree with Plan of Care Patient             Patient will benefit from skilled therapeutic intervention in order to improve the following deficits and impairments:  Decreased coordination, Decreased endurance, Increased fascial restricitons, Improper body mechanics, Impaired flexibility, Decreased mobility, Decreased strength  Visit Diagnosis: Lack of coordination - Plan: PT plan of care cert/re-cert  Muscle weakness (generalized) - Plan: PT plan of care cert/re-cert     Problem List Patient Active Problem List   Diagnosis Date Noted   Varicose veins of lower extremities with other complications 82/80/0349   Fibroids 09/23/2011   S/P endometrial ablation 09/23/2011    IUD 09/23/2011   H/O bladder repair surgery 09/23/2011   Stacy Gardner, PT, DPT 05/01/2209:33 AM   [facility] @ [address] [place], [place], [address] Phone: [phone]   Fax:  [phone]  Name: [name] MRN: [id number] Date  of Birth: 1960/07/15

## 2021-05-01 NOTE — Patient Instructions (Signed)

## 2021-05-28 ENCOUNTER — Other Ambulatory Visit: Payer: Self-pay

## 2021-05-28 ENCOUNTER — Ambulatory Visit: Payer: POS | Attending: Obstetrics and Gynecology | Admitting: Physical Therapy

## 2021-05-28 DIAGNOSIS — R279 Unspecified lack of coordination: Secondary | ICD-10-CM | POA: Diagnosis not present

## 2021-05-28 NOTE — Therapy (Signed)
[facility] @ [facility] [place], [place], [address] Phone: [phone]   Fax:  [phone]  Physical Therapy Treatment  Patient Details  Name: [name] MRN: [id number] Date of Birth: 11-Nov-1960 Referring Provider (PT): Donnel Saxon, [place]   Encounter Date: 05/28/2021   PT End of Session - 05/28/21 1311     Visit Number 2    Date for PT Re-Evaluation 08/29/21    Authorization Type [employer]    PT Start Time 4627    PT Stop Time 1310    PT Time Calculation (min) 40 min    Activity Tolerance Patient tolerated treatment well    Behavior During Therapy [facility] for tasks assessed/performed             Past Medical History:  Diagnosis Date   Breakthrough bleeding 2001   Fibroids, submucosal    GERD (gastroesophageal reflux disease)    H/O carpal tunnel syndrome    H/O fatigue    H/O menorrhagia    Hx of thyroid nodule    Hx: UTI (urinary tract infection)    Irritable bowel disease    Mastalgia 2010   Postcoital bleeding 2012   Urinary incontinence, mixed 2009   Vaginal atrophy    Varicose veins    Yeast infection     Past Surgical History:  Procedure Laterality Date    S/P bilateral tubal ligation  1998   carpal tunnel repair  right   DILATION AND CURETTAGE OF UTERUS  07/14/2009   HAMMER TOE SURGERY  1996   HYSTEROSCOPY  07/14/2009   INTRAUTERINE DEVICE INSERTION     NOVASURE ABLATION  07/14/2009   THYROIDECTOMY, PARTIAL     TUBAL LIGATION  1998   bilateral    There were no vitals filed for this visit.   Subjective Assessment - 05/28/21 1232     Subjective Pt reports she hasn't noticed a difference in symptoms.    Pertinent History ~2003 pt had bladder lifting surgery per pt    How long can you sit comfortably? no limits    How long can you stand comfortably? no limits    How long can you walk comfortably? no limits    Patient Stated Goals to have less leakage    Currently in Pain? No/denies                             Pelvic Floor Special Questions - 05/28/21 0001     Pelvic Floor Internal Exam patient identified and patient confirms consent for PT to perform internal soft tissue work and muscle strength and integrity assessment    Exam Type Vaginal    Sensation WFL    Palpation no TTP    Strength weak squeeze, no lift   Pt directed in 3x10 contract/relax, 0J50 quick flicks, and K93 isometic holds with pt able to hold ~6s each time.   Strength # of reps 6    Strength # of seconds 5    Tone decreased but improved               [facility] Adult PT Treatment/Exercise - 05/28/21 0001       Self-Care   Self-Care Other Self-Care Comments    Other Self-Care Comments  Pt educated on vaginal weights, HEP, urge drill,  and breathing pattern with pelvic floor contraction.  PT Education - 05/28/21 1310     Education Details Pt educated on urge drill, sitting to urinate to relax pelvic floor, vaginal weights and coordinating pelvic floor with activity, HEP    Person(s) Educated Patient    Methods Explanation;Demonstration;Tactile cues;Handout    Comprehension Returned demonstration;Verbalized understanding              PT Short Term Goals - 05/01/21 1028       PT SHORT TERM GOAL #1   Title Pt to be I with HEP    Time 5    Period Weeks    Status New    Target Date 06/05/21      PT SHORT TERM GOAL #2   Title pt to report ability to hold urine for 1 hour with no more than 1 leak between voids.    Time 5    Period Weeks    Status New    Target Date 06/05/21      PT SHORT TERM GOAL #3   Title pt to demonstrate at least 2/5 pelvic floor strength to decrease leakage symptoms    Baseline 0/5    Time 5    Period Weeks    Status New    Target Date 06/05/21      PT SHORT TERM GOAL #4   Title pt to recall good voiding mechanics and breathing techniques to fully void urine and stool when needed.    Time 5    Period Weeks     Status New    Target Date 06/05/21               PT Long Term Goals - 05/01/21 1030       PT LONG TERM GOAL #1   Title Pt to be I with advanced HEP    Time 4    Period Months    Status New    Target Date 08/29/21      PT LONG TERM GOAL #2   Title pt to report ability to hold urine for 3 hours with no more than 1 leak between voids to improve QOL.    Time 4    Period Months    Status New    Target Date 08/29/21      PT LONG TERM GOAL #3   Title pt to demonstrate at least 4/5 pelvic floor strength to decrease leakage symptoms    Time 4    Period Months    Status New    Target Date 08/29/21      PT LONG TERM GOAL #4   Title pt to demonstrate improved coordination with pelvic floor contract/relax and breathing mechanics with activity at least 75% of the time to decrease strain and leakage at pelvic floor.    Time 4    Period Months    Status New    Target Date 08/29/21                   Plan - 05/28/21 1311     Clinical Impression Statement Pt presents to clinic reporting no change in symptoms, also still hovering over toliet seat at work. Pt educated on voiding mechanics, breathing mechanics, HEP given and urge drill. Pt consented to internal vaginal pelvic floor treatment this date, and demonstrated improvement with ability to contract though still decreased strength, endurance, and coordination. Pt directed in 3x10 contract/relax, 7Q25 quick flicks, and Z56 isometic holds with pt able to hold ~6s each time. Pt denied additional  questions at end of session, verbalized understanding of urge drill and agreeable to try this and sitting to urinate. Pt continues to demonstrate need of PT to further address deficits.    Personal Factors and Comorbidities Comorbidity 1    Comorbidities x2 vaginal births    Examination-Activity Limitations Continence;Hygiene/Grooming;Toileting;Lift;Locomotion Level    Examination-Participation Restrictions Community  Activity;Occupation    Stability/Clinical Decision Making Stable/Uncomplicated    Rehab Potential Good    PT Frequency 1x / week    PT Duration Other (comment)   10 weeks   PT Treatment/Interventions ADLs/Self Care Home Management;Aquatic Therapy;Functional mobility training;Therapeutic activities;Therapeutic exercise;Neuromuscular re-education;Manual techniques;Patient/family education;Scar mobilization;Passive range of motion;Energy conservation    PT Next Visit Plan bladder retraining and pelvic floor coordination    PT Home Exercise Plan bladder irritants    Consulted and Agree with Plan of Care Patient             Patient will benefit from skilled therapeutic intervention in order to improve the following deficits and impairments:  Decreased coordination, Decreased endurance, Increased fascial restricitons, Improper body mechanics, Impaired flexibility, Decreased mobility, Decreased strength  Visit Diagnosis: Lack of coordination     Problem List Patient Active Problem List   Diagnosis Date Noted   Varicose veins of lower extremities with other complications 62/08/5595   Fibroids 09/23/2011   S/P endometrial ablation 09/23/2011   IUD 09/23/2011   H/O bladder repair surgery 09/23/2011    No emotional/communication barriers or cognitive limitation. Patient is motivated to learn. Patient understands and agrees with treatment goals and plan. PT explains patient will be examined in standing, sitting, and lying down to see how their muscles and joints work. When they are ready, they will be asked to remove their underwear so PT can examine their perineum. The patient is also given the option of providing their own chaperone as one is not provided in our facility. The patient also has the right and is explained the right to defer or refuse any part of the evaluation or treatment including the internal exam. With the patient's consent, PT will use one gloved finger to gently assess the  muscles of the pelvic floor, seeing how well it contracts and relaxes and if there is muscle symmetry. After, the patient will get dressed and PT and patient will discuss exam findings and plan of care. PT and patient discuss plan of care, schedule, attendance policy and HEP activities.   Stacy Gardner, PT, DPT 12/19/221:15 PM   [facility] @ [facility], [place], [address] Phone: [phone]   Fax:  [phone]  Name: SALIAH CRISP MRN: [id number] Date of Birth: 05-Sep-1960

## 2021-05-28 NOTE — Patient Instructions (Signed)

## 2021-06-04 ENCOUNTER — Encounter: Payer: Self-pay | Admitting: Physical Therapy

## 2021-06-07 ENCOUNTER — Encounter: Payer: POS | Admitting: Physical Therapy

## 2021-06-14 ENCOUNTER — Ambulatory Visit: Payer: POS | Attending: Obstetrics and Gynecology | Admitting: Physical Therapy

## 2021-06-14 ENCOUNTER — Other Ambulatory Visit: Payer: Self-pay

## 2021-06-14 DIAGNOSIS — M6281 Muscle weakness (generalized): Secondary | ICD-10-CM | POA: Diagnosis not present

## 2021-06-14 DIAGNOSIS — R279 Unspecified lack of coordination: Secondary | ICD-10-CM | POA: Insufficient documentation

## 2021-06-14 NOTE — Therapy (Signed)
[facility] @ [facility], [place], [address] Phone: [phone]   Fax:  [phone]  Physical Therapy Treatment  Patient Details  Name: [name] MRN: [id number] Date of Birth: 11/06/1960 Referring Provider (PT): Donnel Saxon, [place]   Encounter Date: 06/14/2021   PT End of Session - 06/14/21 1042     Visit Number 3    Date for PT Re-Evaluation 08/29/21    Authorization Type [employer]    PT Start Time 1010    PT Stop Time 1050    PT Time Calculation (min) 40 min    Activity Tolerance Patient tolerated treatment well    Behavior During Therapy [facility] for tasks assessed/performed             Past Medical History:  Diagnosis Date   Breakthrough bleeding 2001   Fibroids, submucosal    GERD (gastroesophageal reflux disease)    H/O carpal tunnel syndrome    H/O fatigue    H/O menorrhagia    Hx of thyroid nodule    Hx: UTI (urinary tract infection)    Irritable bowel disease    Mastalgia 2010   Postcoital bleeding 2012   Urinary incontinence, mixed 2009   Vaginal atrophy    Varicose veins    Yeast infection     Past Surgical History:  Procedure Laterality Date    S/P bilateral tubal ligation  1998   carpal tunnel repair  right   DILATION AND CURETTAGE OF UTERUS  07/14/2009   HAMMER TOE SURGERY  1996   HYSTEROSCOPY  07/14/2009   INTRAUTERINE DEVICE INSERTION     NOVASURE ABLATION  07/14/2009   THYROIDECTOMY, PARTIAL     TUBAL LIGATION  1998   bilateral    There were no vitals filed for this visit.   Subjective Assessment - 06/14/21 1011     Subjective Pt reports at home urinarting every 3 hours - 4hours and doesn't get as strong of an urge to urinate either. At work she does more lifting and more active and wonders if this makes her go more as she urinates every hour with strong urge. Sometimes notices leakage at work between voids but not all the time. Had 2 weeks off and has been doing urge drill  and HEP which has been helpful. Most of the time notices leakage with lifting items at work and if waiting too long to go, though less frequently now. Pt still unable to sit for voids at work per her preference.    Pertinent History ~2003 pt had bladder lifting surgery per pt    How long can you sit comfortably? no limits    How long can you stand comfortably? no limits    How long can you walk comfortably? no limits    Patient Stated Goals to have less leakage    Currently in Pain? No/denies                               [facility] Adult PT Treatment/Exercise - 06/14/21 0001       Exercises   Exercises Lumbar;Knee/Hip      Lumbar Exercises: Standing   Functional Squats 10 reps;Limitations    Functional Squats Limitations 10# kettlebell coordination with PF and breathing    Other Standing Lumbar Exercises mario punches 5# x10 each side    Other Standing Lumbar Exercises palloff blue band x10 each  Lumbar Exercises: Supine   Clam 20 reps;Limitations    Clam Limitations black loop with coordination of PF and exhale    Bridge 10 reps    Bridge Limitations with coordination of PF and exhale    Other Supine Lumbar Exercises hip flexion with knee flexion with black loop x10 with PF and exhale coordination      Lumbar Exercises: Quadruped   Opposite Arm/Leg Raise Right arm/Left leg;Left arm/Right leg;10 reps    Other Quadruped Lumbar Exercises knee hovers unilateral ball with PF and exhale coordination x10 each arm                     PT Education - 06/14/21 1042     Education Details Pt educated on coorindation with breathing mechanics and PF activation with all exercises during session    Person(s) Educated Patient    Methods Explanation;Demonstration;Tactile cues;Verbal cues    Comprehension Verbalized understanding;Returned demonstration              PT Short Term Goals - 06/14/21 1054       PT SHORT TERM GOAL #1   Title Pt to be I with  HEP    Time 5    Period Weeks    Status Achieved    Target Date 06/05/21      PT SHORT TERM GOAL #2   Title pt to report ability to hold urine for 1 hour with no more than 1 leak between voids.    Time 5    Period Weeks    Status Achieved    Target Date 06/05/21      PT SHORT TERM GOAL #3   Title pt to demonstrate at least 2/5 pelvic floor strength to decrease leakage symptoms    Baseline 0/5    Time 5    Period Weeks    Status Achieved    Target Date 06/05/21      PT SHORT TERM GOAL #4   Title pt to recall good voiding mechanics and breathing techniques to fully void urine and stool when needed.    Time 5    Period Weeks    Status Achieved    Target Date 06/05/21               PT Long Term Goals - 06/14/21 1055       PT LONG TERM GOAL #1   Title Pt to be I with advanced HEP    Time 4    Period Months    Status On-going    Target Date 08/29/21      PT LONG TERM GOAL #2   Title pt to report ability to hold urine for 3 hours with no more than 1 leak between voids to improve QOL.    Time 4    Period Months    Status On-going    Target Date 08/29/21      PT LONG TERM GOAL #3   Title pt to demonstrate at least 4/5 pelvic floor strength to decrease leakage symptoms    Time 4    Period Months    Status On-going    Target Date 08/29/21      PT LONG TERM GOAL #4   Title pt to demonstrate improved coordination with pelvic floor contract/relax and breathing mechanics with activity at least 75% of the time to decrease strain and leakage at pelvic floor.    Time 4    Period Months    Status  On-going    Target Date 08/29/21                   Plan - 06/14/21 1043     Clinical Impression Statement PT presents to clinic reporting she has seen improvement with leakage and frequency of urine, more so at home than at work however has had less leakage at work as well. Pt continues to be unable to sit at toliet to urinate at work due to hygiene concerns which  may be limiting her ability to relax pelvic floor. Pt reports at home she is urinating 3-4 hours with minimal leakage infrequently however at work every hour and leakage with lifting items or waiting longer than an hour to urinate. Pt reports she feels she is now able to understand when she is contracting pelvic floor better while completing HEP at home. Pt reports she attempts to lift correctly at work but notices she does hold her breath sometimes and then has leakage. Pt session focused on hip and core strengthening with coordination of pelvic floor and breathing mechanics for improved carry over and improved strength for functional mobility and pelvic stability. Pt continues to demonstrate need of PT to further address deficits.    Personal Factors and Comorbidities Comorbidity 1    Comorbidities x2 vaginal births    Examination-Activity Limitations Continence;Hygiene/Grooming;Toileting;Lift;Locomotion Level    Examination-Participation Restrictions Community Activity;Occupation    Stability/Clinical Decision Making Stable/Uncomplicated    Rehab Potential Good    PT Frequency 1x / week    PT Duration Other (comment)   10 weeks   PT Treatment/Interventions ADLs/Self Care Home Management;Aquatic Therapy;Functional mobility training;Therapeutic activities;Therapeutic exercise;Neuromuscular re-education;Manual techniques;Patient/family education;Scar mobilization;Passive range of motion;Energy conservation    PT Next Visit Plan bladder retraining and pelvic floor coordination    PT Home Exercise Plan [id number]    Consulted and Agree with Plan of Care Patient             Patient will benefit from skilled therapeutic intervention in order to improve the following deficits and impairments:  Decreased coordination, Decreased endurance, Increased fascial restricitons, Improper body mechanics, Impaired flexibility, Decreased mobility, Decreased strength  Visit Diagnosis: Muscle weakness  (generalized)  Lack of coordination     Problem List Patient Active Problem List   Diagnosis Date Noted   Varicose veins of lower extremities with other complications 30/86/5784   Fibroids 09/23/2011   S/P endometrial ablation 09/23/2011   IUD 09/23/2011   H/O bladder repair surgery 09/23/2011    Stacy Gardner, PT, DPT 06/14/2308:55 AM   [facility] @ [address] [place], [place], [address] Phone: [phone]   Fax:  [phone]  Name: [name] MRN: [id number] Date of Birth: 10/15/1960

## 2021-06-18 ENCOUNTER — Other Ambulatory Visit: Payer: Self-pay

## 2021-06-18 ENCOUNTER — Ambulatory Visit: Payer: POS | Admitting: Physical Therapy

## 2021-06-18 DIAGNOSIS — R279 Unspecified lack of coordination: Secondary | ICD-10-CM

## 2021-06-18 DIAGNOSIS — M6281 Muscle weakness (generalized): Secondary | ICD-10-CM

## 2021-06-18 NOTE — Therapy (Signed)
[facility] @ [facility] [place], [place], [address] Phone: [phone]   Fax:  [phone]  Physical Therapy Treatment  Patient Details  Name: [name] MRN: [id number] Date of Birth: 04-19-1961 Referring Provider (PT): Donnel Saxon, [place]   Encounter Date: 06/18/2021   PT End of Session - 06/18/21 1532     Visit Number 4    Date for PT Re-Evaluation 08/29/21    Authorization Type [employer]    PT Start Time 1446    PT Stop Time 1526    PT Time Calculation (min) 40 min    Activity Tolerance Patient tolerated treatment well    Behavior During Therapy [facility] for tasks assessed/performed             Past Medical History:  Diagnosis Date   Breakthrough bleeding 2001   Fibroids, submucosal    GERD (gastroesophageal reflux disease)    H/O carpal tunnel syndrome    H/O fatigue    H/O menorrhagia    Hx of thyroid nodule    Hx: UTI (urinary tract infection)    Irritable bowel disease    Mastalgia 2010   Postcoital bleeding 2012   Urinary incontinence, mixed 2009   Vaginal atrophy    Varicose veins    Yeast infection     Past Surgical History:  Procedure Laterality Date    S/P bilateral tubal ligation  1998   carpal tunnel repair  right   DILATION AND CURETTAGE OF UTERUS  07/14/2009   HAMMER TOE SURGERY  1996   HYSTEROSCOPY  07/14/2009   INTRAUTERINE DEVICE INSERTION     NOVASURE ABLATION  07/14/2009   THYROIDECTOMY, PARTIAL     TUBAL LIGATION  1998   bilateral    There were no vitals filed for this visit.   Subjective Assessment - 06/18/21 1448     Subjective Pt reports she is now able to hold up to 2 hours at work now with no more leakage than prior but does have some with lifting. Pt report no symptoms while at home and able to hold urine at least 4 hours.    Pertinent History ~2003 pt had bladder lifting surgery per pt    How long can you sit comfortably? no limits    How long can you stand  comfortably? no limits    How long can you walk comfortably? no limits    Patient Stated Goals to have less leakage    Currently in Pain? No/denies                               [facility] Adult PT Treatment/Exercise - 06/18/21 0001       Exercises   Exercises Lumbar;Knee/Hip      Lumbar Exercises: Standing   Functional Squats 10 reps;Limitations    Functional Squats Limitations 15# kettlebell coordination with PF and breathing    Forward Lunge 10 reps   with blue band row with backward lunge   Other Standing Lumbar Exercises mario punches 5# x10 each side    Other Standing Lumbar Exercises palloff blue band x10 each      Lumbar Exercises: Supine   Clam 20 reps;Limitations    Clam Limitations black loop with coordination of PF and exhale    Bridge 10 reps    Bridge Limitations with coordination of PF and exhale    Other Supine Lumbar Exercises hip flexion with  knee flexion with black loop x10 with PF and exhale coordination      Lumbar Exercises: Quadruped   Opposite Arm/Leg Raise Right arm/Left leg;Left arm/Right leg;10 reps    Other Quadruped Lumbar Exercises knee hovers unilateral ball with PF and exhale coordination x10 each arm                     PT Education - 06/18/21 1514     Education Details Pt educated on continued pelvic floor and breathing connection with activity    Person(s) Educated Patient    Methods Explanation;Demonstration;Tactile cues;Verbal cues    Comprehension Returned demonstration;Verbalized understanding              PT Short Term Goals - 06/14/21 1054       PT SHORT TERM GOAL #1   Title Pt to be I with HEP    Time 5    Period Weeks    Status Achieved    Target Date 06/05/21      PT SHORT TERM GOAL #2   Title pt to report ability to hold urine for 1 hour with no more than 1 leak between voids.    Time 5    Period Weeks    Status Achieved    Target Date 06/05/21      PT SHORT TERM GOAL #3   Title pt  to demonstrate at least 2/5 pelvic floor strength to decrease leakage symptoms    Baseline 0/5    Time 5    Period Weeks    Status Achieved    Target Date 06/05/21      PT SHORT TERM GOAL #4   Title pt to recall good voiding mechanics and breathing techniques to fully void urine and stool when needed.    Time 5    Period Weeks    Status Achieved    Target Date 06/05/21               PT Long Term Goals - 06/14/21 1055       PT LONG TERM GOAL #1   Title Pt to be I with advanced HEP    Time 4    Period Months    Status On-going    Target Date 08/29/21      PT LONG TERM GOAL #2   Title pt to report ability to hold urine for 3 hours with no more than 1 leak between voids to improve QOL.    Time 4    Period Months    Status On-going    Target Date 08/29/21      PT LONG TERM GOAL #3   Title pt to demonstrate at least 4/5 pelvic floor strength to decrease leakage symptoms    Time 4    Period Months    Status On-going    Target Date 08/29/21      PT LONG TERM GOAL #4   Title pt to demonstrate improved coordination with pelvic floor contract/relax and breathing mechanics with activity at least 75% of the time to decrease strain and leakage at pelvic floor.    Time 4    Period Months    Status On-going    Target Date 08/29/21                   Plan - 06/18/21 1532     Clinical Impression Statement Pt presents to clinic reporting she has noticed some improvement in symptoms while at work with  increased time between voids to 2 hour now and minimal leakage overall with lifting usually and longer having symptoms at home. Pt session focused on hip and core strengthening with proper coordination of breathing and pelvic floor and core activiation. Pt tolerated well without any leakage and reports she has been trying to continue these mechanics at work when lifting which as helped. Pt session focused on hip and core strengthening with coordination of pelvic floor and  breathing mechanics for improved carry over and improved strength for functional mobility and pelvic stability. Pt continues to demonstrate need of PT to further address deficits.    Personal Factors and Comorbidities Comorbidity 1    Comorbidities x2 vaginal births    Examination-Activity Limitations Continence;Hygiene/Grooming;Toileting;Lift;Locomotion Level    Examination-Participation Restrictions Community Activity;Occupation    Stability/Clinical Decision Making Stable/Uncomplicated    Rehab Potential Good    PT Frequency 1x / week    PT Duration Other (comment)   10 weeks   PT Treatment/Interventions ADLs/Self Care Home Management;Aquatic Therapy;Functional mobility training;Therapeutic activities;Therapeutic exercise;Neuromuscular re-education;Manual techniques;Patient/family education;Scar mobilization;Passive range of motion;Energy conservation    PT Next Visit Plan bladder retraining and pelvic floor coordination    PT Home Exercise Plan [id number]    Consulted and Agree with Plan of Care Patient             Patient will benefit from skilled therapeutic intervention in order to improve the following deficits and impairments:  Decreased coordination, Decreased endurance, Increased fascial restricitons, Improper body mechanics, Impaired flexibility, Decreased mobility, Decreased strength  Visit Diagnosis: Lack of coordination  Muscle weakness (generalized)     Problem List Patient Active Problem List   Diagnosis Date Noted   Varicose veins of lower extremities with other complications 50/38/8828   Fibroids 09/23/2011   S/P endometrial ablation 09/23/2011   IUD 09/23/2011   H/O bladder repair surgery 09/23/2011    Stacy Gardner, PT, DPT 01/09/233:35 PM   [facility] @ [place], [place], [address] Phone: [phone]   Fax:  [phone]  Name: [name] MRN: [id number] Date of Birth:  [birth year]

## 2021-06-25 ENCOUNTER — Other Ambulatory Visit: Payer: Self-pay

## 2021-06-25 ENCOUNTER — Ambulatory Visit: Payer: POS | Admitting: Physical Therapy

## 2021-06-25 DIAGNOSIS — R279 Unspecified lack of coordination: Secondary | ICD-10-CM

## 2021-06-25 DIAGNOSIS — M6281 Muscle weakness (generalized): Secondary | ICD-10-CM | POA: Diagnosis not present

## 2021-06-25 NOTE — Therapy (Signed)
[facility] @ [place], [place], [address] Phone: [phone]   Fax:  [phone]  Physical Therapy Treatment  Patient Details  Name: [name] MRN: [id number] Date of Birth: 28-Aug-1960 Referring Provider (PT): Donnel Saxon, [place]   Encounter Date: 06/25/2021   PT End of Session - 06/25/21 1432     Visit Number 5    Date for PT Re-Evaluation 08/29/21    Authorization Type [employer]    PT Start Time 1405    PT Stop Time 1444    PT Time Calculation (min) 39 min    Activity Tolerance Patient tolerated treatment well    Behavior During Therapy [facility] for tasks assessed/performed             Past Medical History:  Diagnosis Date   Breakthrough bleeding 2001   Fibroids, submucosal    GERD (gastroesophageal reflux disease)    H/O carpal tunnel syndrome    H/O fatigue    H/O menorrhagia    Hx of thyroid nodule    Hx: UTI (urinary tract infection)    Irritable bowel disease    Mastalgia 2010   Postcoital bleeding 2012   Urinary incontinence, mixed 2009   Vaginal atrophy    Varicose veins    Yeast infection     Past Surgical History:  Procedure Laterality Date    S/P bilateral tubal ligation  1998   carpal tunnel repair  right   DILATION AND CURETTAGE OF UTERUS  07/14/2009   HAMMER TOE SURGERY  1996   HYSTEROSCOPY  07/14/2009   INTRAUTERINE DEVICE INSERTION     NOVASURE ABLATION  07/14/2009   THYROIDECTOMY, PARTIAL     TUBAL LIGATION  1998   bilateral    There were no vitals filed for this visit.   Subjective Assessment - 06/25/21 1408     Subjective Pt reports still going 2 hours between bladder voids at work, at home much longer and very minimal leakage. Mostly noticed leakage at home with standing after voiding.    Pertinent History ~2003 pt had bladder lifting surgery per pt    How long can you sit comfortably? no limits    How long can you stand comfortably? no limits    How long can you  walk comfortably? no limits    Patient Stated Goals to have less leakage    Currently in Pain? No/denies                               [facility] Adult PT Treatment/Exercise - 06/25/21 0001       Exercises   Exercises Lumbar;Knee/Hip      Lumbar Exercises: Standing   Functional Squats 10 reps;Limitations    Functional Squats Limitations 10# kettlebell coordination with PF and breathing. Pt reported being a little sore after last session.    Forward Lunge 10 reps    Other Standing Lumbar Exercises mario punches 5# x10 each side    Other Standing Lumbar Exercises palloff green band x10 each      Lumbar Exercises: Supine   Clam 20 reps;Limitations    Clam Limitations black loop with coordination of PF and exhale      Lumbar Exercises: Quadruped   Opposite Arm/Leg Raise Right arm/Left leg;Left arm/Right leg;10 reps    Other Quadruped Lumbar Exercises knee hovers unilateral ball with PF and exhale coordination x10 each arm  Other Quadruped Lumbar Exercises donkey kicks and fire hydrants black loop x10                     PT Education - 06/25/21 1429     Education Details Pt educated on continued techniques for breathing connection with activity and pelvic floor coordination    Person(s) Educated Patient    Methods Explanation;Demonstration;Tactile cues;Verbal cues    Comprehension Returned demonstration;Verbalized understanding              PT Short Term Goals - 06/14/21 1054       PT SHORT TERM GOAL #1   Title Pt to be I with HEP    Time 5    Period Weeks    Status Achieved    Target Date 06/05/21      PT SHORT TERM GOAL #2   Title pt to report ability to hold urine for 1 hour with no more than 1 leak between voids.    Time 5    Period Weeks    Status Achieved    Target Date 06/05/21      PT SHORT TERM GOAL #3   Title pt to demonstrate at least 2/5 pelvic floor strength to decrease leakage symptoms    Baseline 0/5    Time 5     Period Weeks    Status Achieved    Target Date 06/05/21      PT SHORT TERM GOAL #4   Title pt to recall good voiding mechanics and breathing techniques to fully void urine and stool when needed.    Time 5    Period Weeks    Status Achieved    Target Date 06/05/21               PT Long Term Goals - 06/14/21 1055       PT LONG TERM GOAL #1   Title Pt to be I with advanced HEP    Time 4    Period Months    Status On-going    Target Date 08/29/21      PT LONG TERM GOAL #2   Title pt to report ability to hold urine for 3 hours with no more than 1 leak between voids to improve QOL.    Time 4    Period Months    Status On-going    Target Date 08/29/21      PT LONG TERM GOAL #3   Title pt to demonstrate at least 4/5 pelvic floor strength to decrease leakage symptoms    Time 4    Period Months    Status On-going    Target Date 08/29/21      PT LONG TERM GOAL #4   Title pt to demonstrate improved coordination with pelvic floor contract/relax and breathing mechanics with activity at least 75% of the time to decrease strain and leakage at pelvic floor.    Time 4    Period Months    Status On-going    Target Date 08/29/21                   Plan - 06/25/21 1433     Clinical Impression Statement Pt reports she feels similar to last week with progress, does notice improvement overall since starting PT but limited 2/2 not being able to sit toliet at work and pelvic floor unable to relax for urination however is able to hold urine for 2 hours now with very minimal leakage  with lifting. At home, pt able to "hold it much longer and even less leakage.". Pt reports she doesn't notice symptoms and only has leakage if waiting way too long and picks something up. Pt deferred internal until next session, pt session focused on hip and core strengthening with coordination of breathing and pelvic floor for improved pelvis stability, core activiation and decreased urinary leakage. Pt  required session cues compared to previous session and improved techniques throughout.  Pt continues to demonstrate need of PT to further address deficits.    Personal Factors and Comorbidities Comorbidity 1    Comorbidities x2 vaginal births    Examination-Activity Limitations Continence;Hygiene/Grooming;Toileting;Lift;Locomotion Level    Examination-Participation Restrictions Community Activity;Occupation    Stability/Clinical Decision Making Stable/Uncomplicated    Rehab Potential Good    PT Frequency 1x / week    PT Duration Other (comment)   10 weeks   PT Treatment/Interventions ADLs/Self Care Home Management;Aquatic Therapy;Functional mobility training;Therapeutic activities;Therapeutic exercise;Neuromuscular re-education;Manual techniques;Patient/family education;Scar mobilization;Passive range of motion;Energy conservation    PT Next Visit Plan bladder retraining and pelvic floor coordination    PT Home Exercise Plan [email or web address]    Consulted and Agree with Plan of Care Patient             Patient will benefit from skilled therapeutic intervention in order to improve the following deficits and impairments:  Decreased coordination, Decreased endurance, Increased fascial restricitons, Improper body mechanics, Impaired flexibility, Decreased mobility, Decreased strength  Visit Diagnosis: Muscle weakness (generalized)  Lack of coordination     Problem List Patient Active Problem List   Diagnosis Date Noted   Varicose veins of lower extremities with other complications 16/96/7893   Fibroids 09/23/2011   S/P endometrial ablation 09/23/2011   IUD 09/23/2011   H/O bladder repair surgery 09/23/2011   Stacy Gardner, PT, DPT 01/16/232:44 PM   [facility] @ [place], [place], [address] Phone: [phone]   Fax:  [phone]  Name: ASIANA BENNINGER MRN: [id number] Date of Birth: 12/29/1960

## 2021-07-06 ENCOUNTER — Encounter: Payer: POS | Admitting: Physical Therapy

## 2021-07-09 ENCOUNTER — Ambulatory Visit: Payer: POS | Admitting: Physical Therapy

## 2021-07-09 ENCOUNTER — Other Ambulatory Visit: Payer: Self-pay

## 2021-07-09 DIAGNOSIS — M6281 Muscle weakness (generalized): Secondary | ICD-10-CM

## 2021-07-09 DIAGNOSIS — R279 Unspecified lack of coordination: Secondary | ICD-10-CM

## 2021-07-09 NOTE — Therapy (Signed)
[facility] @ [place], [place], [address] Phone: [phone]   Fax:  [phone]  Physical Therapy Treatment  Patient Details  Name: [name] MRN: [id number] Date of Birth: 08-05-1960 Referring Provider (PT): Donnel Saxon, [place]   Encounter Date: 07/09/2021   PT End of Session - 07/09/21 1419     Visit Number 6    Date for PT Re-Evaluation 08/29/21    Authorization Type [employer]    PT Start Time 4403    PT Stop Time 4742    PT Time Calculation (min) 40 min    Activity Tolerance Patient tolerated treatment well    Behavior During Therapy [facility] for tasks assessed/performed             Past Medical History:  Diagnosis Date   Breakthrough bleeding 2001   Fibroids, submucosal    GERD (gastroesophageal reflux disease)    H/O carpal tunnel syndrome    H/O fatigue    H/O menorrhagia    Hx of thyroid nodule    Hx: UTI (urinary tract infection)    Irritable bowel disease    Mastalgia 2010   Postcoital bleeding 2012   Urinary incontinence, mixed 2009   Vaginal atrophy    Varicose veins    Yeast infection     Past Surgical History:  Procedure Laterality Date    S/P bilateral tubal ligation  1998   carpal tunnel repair  right   DILATION AND CURETTAGE OF UTERUS  07/14/2009   HAMMER TOE SURGERY  1996   HYSTEROSCOPY  07/14/2009   INTRAUTERINE DEVICE INSERTION     NOVASURE ABLATION  07/14/2009   THYROIDECTOMY, PARTIAL     TUBAL LIGATION  1998   bilateral    There were no vitals filed for this visit.   Subjective Assessment - 07/09/21 1404     Subjective Pt reports she has had COVID between last visit and now but now cleared to return. Pt reports during this she wasn't doing any HEP due to feeling unwell.    Pertinent History ~2003 pt had bladder lifting surgery per pt    How long can you sit comfortably? no limits    How long can you stand comfortably? no limits    How long can you walk  comfortably? no limits    Patient Stated Goals to have less leakage    Currently in Pain? No/denies                               [facility] Adult PT Treatment/Exercise - 07/09/21 0001       Exercises   Exercises Lumbar;Knee/Hip      Lumbar Exercises: Stretches   Lower Trunk Rotation 5 reps;20 seconds      Lumbar Exercises: Standing   Other Standing Lumbar Exercises palloff green band x10 each; rotation palloff green band x10      Lumbar Exercises: Supine   Bridge 20 reps    Bridge Limitations with coordination of PF and exhale    Other Supine Lumbar Exercises opp arm/knee press with coordinated breathing and PF x10      Lumbar Exercises: Quadruped   Opposite Arm/Leg Raise Right arm/Left leg;Left arm/Right leg;10 reps    Other Quadruped Lumbar Exercises donkey kicks and fire hydrants 2x10      Knee/Hip Exercises: Standing   Functional Squat 10 reps    Functional Squat Limitations body weight  PT Education - 07/09/21 1419     Education Details Pt educated on HEP and voiding mechanics    Person(s) Educated Patient    Methods Explanation;Demonstration;Tactile cues;Verbal cues    Comprehension Verbalized understanding;Returned demonstration              PT Short Term Goals - 06/14/21 1054       PT SHORT TERM GOAL #1   Title Pt to be I with HEP    Time 5    Period Weeks    Status Achieved    Target Date 06/05/21      PT SHORT TERM GOAL #2   Title pt to report ability to hold urine for 1 hour with no more than 1 leak between voids.    Time 5    Period Weeks    Status Achieved    Target Date 06/05/21      PT SHORT TERM GOAL #3   Title pt to demonstrate at least 2/5 pelvic floor strength to decrease leakage symptoms    Baseline 0/5    Time 5    Period Weeks    Status Achieved    Target Date 06/05/21      PT SHORT TERM GOAL #4   Title pt to recall good voiding mechanics and breathing techniques to fully void  urine and stool when needed.    Time 5    Period Weeks    Status Achieved    Target Date 06/05/21               PT Long Term Goals - 06/14/21 1055       PT LONG TERM GOAL #1   Title Pt to be I with advanced HEP    Time 4    Period Months    Status On-going    Target Date 08/29/21      PT LONG TERM GOAL #2   Title pt to report ability to hold urine for 3 hours with no more than 1 leak between voids to improve QOL.    Time 4    Period Months    Status On-going    Target Date 08/29/21      PT LONG TERM GOAL #3   Title pt to demonstrate at least 4/5 pelvic floor strength to decrease leakage symptoms    Time 4    Period Months    Status On-going    Target Date 08/29/21      PT LONG TERM GOAL #4   Title pt to demonstrate improved coordination with pelvic floor contract/relax and breathing mechanics with activity at least 75% of the time to decrease strain and leakage at pelvic floor.    Time 4    Period Months    Status On-going    Target Date 08/29/21                   Plan - 07/09/21 1419     Clinical Impression Statement Pt reports she had not been having leakage while at home but while sick and having more coughing did have some with this and reports she is now holding urine a few hours at home and now up to 2.5 hours at work without leakage between. Pt reports she has had COVID since last visit and this limited her ability to complete HEP as she wasn't feeling well. Pt limited during session due to fatigue and decreased energy however tolerated well with rest breaks and less resistance this session  compared to previous sessions. Pt session focused on pelvic floor activation with proper breathinf coordination with strengthening exercises. Pt denied all leakage during session and demonstrated improvement with mechanics with less cues. Pt continues to demonstrate need of PT to further address deficits.    Personal Factors and Comorbidities Comorbidity 1     Comorbidities x2 vaginal births    Examination-Activity Limitations Continence;Hygiene/Grooming;Toileting;Lift;Locomotion Level    Examination-Participation Restrictions Community Activity;Occupation    Stability/Clinical Decision Making Stable/Uncomplicated    Rehab Potential Good    PT Frequency 1x / week    PT Duration Other (comment)   10 weeks   PT Treatment/Interventions ADLs/Self Care Home Management;Aquatic Therapy;Functional mobility training;Therapeutic activities;Therapeutic exercise;Neuromuscular re-education;Manual techniques;Patient/family education;Scar mobilization;Passive range of motion;Energy conservation    PT Next Visit Plan bladder retraining and pelvic floor coordination    PT Home Exercise Plan [email or web address]    Consulted and Agree with Plan of Care Patient             Patient will benefit from skilled therapeutic intervention in order to improve the following deficits and impairments:  Decreased coordination, Decreased endurance, Increased fascial restricitons, Improper body mechanics, Impaired flexibility, Decreased mobility, Decreased strength  Visit Diagnosis: Lack of coordination  Muscle weakness (generalized)     Problem List Patient Active Problem List   Diagnosis Date Noted   Varicose veins of lower extremities with other complications 00/17/4944   Fibroids 09/23/2011   S/P endometrial ablation 09/23/2011   IUD 09/23/2011   H/O bladder repair surgery 09/23/2011   Stacy Gardner, PT, DPT 01/30/232:44 PM   [facility] @ [place], [place], [address] Phone: [phone]   Fax:  [phone]  Name: LIA VIGILANTE MRN: [id number] Date of Birth: Feb 28, 1961

## 2021-07-16 ENCOUNTER — Other Ambulatory Visit: Payer: Self-pay

## 2021-07-16 ENCOUNTER — Ambulatory Visit: Payer: POS | Attending: Obstetrics and Gynecology | Admitting: Physical Therapy

## 2021-07-16 DIAGNOSIS — M6281 Muscle weakness (generalized): Secondary | ICD-10-CM | POA: Diagnosis not present

## 2021-07-16 DIAGNOSIS — R278 Other lack of coordination: Secondary | ICD-10-CM | POA: Insufficient documentation

## 2021-07-16 NOTE — Therapy (Signed)
[facility] @ [place] [address] [place], [place], [address] Phone: [phone]   Fax:  [phone]  Physical Therapy Treatment  Patient Details  Name: KYMBERLI WIEGAND MRN: [id number] Date of Birth: 11-07-1960 Referring Provider (PT): Donnel Saxon, CNM   Encounter Date: 07/16/2021   PT End of Session - 07/16/21 1436     Visit Number 7    Date for PT Re-Evaluation 08/29/21    Authorization Type [employer]    PT Start Time 1400    PT Stop Time 1430   pt denied additional PT treatment post discharge session   PT Time Calculation (min) 30 min    Activity Tolerance Patient tolerated treatment well    Behavior During Therapy [facility] for tasks assessed/performed             Past Medical History:  Diagnosis Date   Breakthrough bleeding 2001   Fibroids, submucosal    GERD (gastroesophageal reflux disease)    H/O carpal tunnel syndrome    H/O fatigue    H/O menorrhagia    Hx of thyroid nodule    Hx: UTI (urinary tract infection)    Irritable bowel disease    Mastalgia 2010   Postcoital bleeding 2012   Urinary incontinence, mixed 2009   Vaginal atrophy    Varicose veins    Yeast infection     Past Surgical History:  Procedure Laterality Date    S/P bilateral tubal ligation  1998   carpal tunnel repair  right   DILATION AND CURETTAGE OF UTERUS  07/14/2009   HAMMER TOE SURGERY  1996   HYSTEROSCOPY  07/14/2009   INTRAUTERINE DEVICE INSERTION     NOVASURE ABLATION  07/14/2009   THYROIDECTOMY, PARTIAL     TUBAL LIGATION  1998   bilateral    There were no vitals filed for this visit.   Subjective Assessment - 07/16/21 1402     Subjective Pt reports she is feeling better since having COVID, reports the leakage has been "really good" overall. At work, "far better than it was and I don't have to rush to the bathroom". Now able to hold urine at least 3-4 hours without leakage most of the time. Only notices leakage at work now with  lifting or when rushing from the bathroom.    Pertinent History ~2003 pt had bladder lifting surgery per pt    How long can you sit comfortably? no limits    How long can you stand comfortably? no limits    How long can you walk comfortably? no limits    Currently in Pain? No/denies                            Pelvic Floor Special Questions - 07/16/21 0001     Pelvic Floor Internal Exam patient identified and patient confirms consent for PT to perform internal soft tissue work and muscle strength and integrity assessment    Exam Type Vaginal    Sensation WFL    Palpation no TTP    Strength good squeeze, good lift, able to hold agaisnt strong resistance    Strength # of reps 5    Strength # of seconds 8    T[facility]               [facility] Adult PT Treatment/Exercise - 07/16/21 0001       Manual Therapy   Manual Therapy Internal Pelvic Floor  Manual therapy comments pt directed in 2x10 pelvic contractions, [id number] flicks and x5 92E isometric holds. Only one instance of cues for not holding breath.                     PT Education - 07/16/21 1439     Education Details pt educated on DC recommendations of continuing HEP, bladder training and to attempt to maintain 3-4 hour bladder voids as she is no longer having leakage with this. Pt limited on voiding mechanics at work but is retiring this month.    Person(s) Educated Patient    Methods Explanation;Demonstration;Tactile cues;Verbal cues    Comprehension Verbalized understanding;Returned demonstration              PT Short Term Goals - 06/14/21 1054       PT SHORT TERM GOAL #1   Title Pt to be I with HEP    Time 5    Period Weeks    Status Achieved    Target Date 06/05/21      PT SHORT TERM GOAL #2   Title pt to report ability to hold urine for 1 hour with no more than 1 leak between voids.    Time 5    Period Weeks    Status Achieved    Target Date 06/05/21      PT SHORT TERM  GOAL #3   Title pt to demonstrate at least 2/5 pelvic floor strength to decrease leakage symptoms    Baseline 0/5    Time 5    Period Weeks    Status Achieved    Target Date 06/05/21      PT SHORT TERM GOAL #4   Title pt to recall good voiding mechanics and breathing techniques to fully void urine and stool when needed.    Time 5    Period Weeks    Status Achieved    Target Date 06/05/21               PT Long Term Goals - 06/14/21 1055       PT LONG TERM GOAL #1   Title Pt to be I with advanced HEP    Time 4    Period Months    Status On-going    Target Date 08/29/21      PT LONG TERM GOAL #2   Title pt to report ability to hold urine for 3 hours with no more than 1 leak between voids to improve QOL.    Time 4    Period Months    Status On-going    Target Date 08/29/21      PT LONG TERM GOAL #3   Title pt to demonstrate at least 4/5 pelvic floor strength to decrease leakage symptoms    Time 4    Period Months    Status On-going    Target Date 08/29/21      PT LONG TERM GOAL #4   Title pt to demonstrate improved coordination with pelvic floor contract/relax and breathing mechanics with activity at least 75% of the time to decrease strain and leakage at pelvic floor.    Time 4    Period Months    Status On-going    Target Date 08/29/21                    Patient will benefit from skilled therapeutic intervention in order to improve the following deficits and impairments:     Visit  Diagnosis: Muscle weakness (generalized)  Other lack of coordination     Problem List Patient Active Problem List   Diagnosis Date Noted   Varicose veins of lower extremities with other complications 44/06/270   Fibroids 09/23/2011   S/P endometrial ablation 09/23/2011   IUD 09/23/2011   H/O bladder repair surgery 09/23/2011   No emotional/communication barriers or cognitive limitation. Patient is motivated to learn. Patient understands and agrees with  treatment goals and plan. PT explains patient will be examined in standing, sitting, and lying down to see how their muscles and joints work. When they are ready, they will be asked to remove their underwear so PT can examine their perineum. The patient is also given the option of providing their own chaperone as one is not provided in our facility. The patient also has the right and is explained the right to defer or refuse any part of the evaluation or treatment including the internal exam. With the patient's consent, PT will use one gloved finger to gently assess the muscles of the pelvic floor, seeing how well it contracts and relaxes and if there is muscle symmetry. After, the patient will get dressed and PT and patient will discuss exam findings and plan of care. PT and patient discuss plan of care, schedule, attendance policy and HEP activities.    PHYSICAL THERAPY DISCHARGE SUMMARY  Visits from Start of Care: 7  Current functional level related to goals / functional outcomes: All goals met   Remaining deficits: All goals met   Education / Equipment: HEP   Patient agrees to discharge. Patient goals were met. Patient is being discharged due to meeting the stated rehab goals.     Junie Panning, PT 07/16/2021, 2:40 PM  [facility] @ [place], [place], [address] Phone: [phone]   Fax:  [phone]  Name: TIFFNEY HAUGHTON MRN: [id number] Date of Birth: 1961-02-22

## 2021-07-23 ENCOUNTER — Encounter: Payer: POS | Admitting: Physical Therapy

## 2021-07-30 ENCOUNTER — Encounter: Payer: POS | Admitting: Physical Therapy

## 2021-08-06 ENCOUNTER — Encounter: Payer: POS | Admitting: Physical Therapy

## 2021-12-05 ENCOUNTER — Telehealth: Payer: Self-pay | Admitting: Internal Medicine

## 2021-12-05 NOTE — Telephone Encounter (Signed)
Scheduled appt per 6/28 referral. Pt is aware of appt date and time. Pt is aware to arrive 15 mins prior to appt time and to bring and updated insurance card. Pt is aware of appt location.

## 2021-12-24 ENCOUNTER — Other Ambulatory Visit: Payer: Self-pay | Admitting: Internal Medicine

## 2021-12-24 DIAGNOSIS — D72819 Decreased white blood cell count, unspecified: Secondary | ICD-10-CM

## 2021-12-25 ENCOUNTER — Other Ambulatory Visit: Payer: Self-pay | Admitting: Internal Medicine

## 2021-12-26 ENCOUNTER — Inpatient Hospital Stay: Payer: POS

## 2021-12-26 ENCOUNTER — Inpatient Hospital Stay: Payer: POS | Attending: Internal Medicine | Admitting: Internal Medicine

## 2021-12-26 ENCOUNTER — Other Ambulatory Visit: Payer: Self-pay

## 2021-12-26 VITALS — BP 109/71 | HR 66 | Temp 97.5°F | Resp 18 | Wt 138.4 lb

## 2021-12-26 DIAGNOSIS — R1013 Epigastric pain: Secondary | ICD-10-CM | POA: Insufficient documentation

## 2021-12-26 DIAGNOSIS — K30 Functional dyspepsia: Secondary | ICD-10-CM

## 2021-12-26 DIAGNOSIS — Z885 Allergy status to narcotic agent status: Secondary | ICD-10-CM | POA: Insufficient documentation

## 2021-12-26 DIAGNOSIS — D72819 Decreased white blood cell count, unspecified: Secondary | ICD-10-CM

## 2021-12-26 DIAGNOSIS — D649 Anemia, unspecified: Secondary | ICD-10-CM | POA: Insufficient documentation

## 2021-12-26 DIAGNOSIS — Z86018 Personal history of other benign neoplasm: Secondary | ICD-10-CM | POA: Insufficient documentation

## 2021-12-26 DIAGNOSIS — R42 Dizziness and giddiness: Secondary | ICD-10-CM | POA: Insufficient documentation

## 2021-12-26 DIAGNOSIS — Z87440 Personal history of urinary (tract) infections: Secondary | ICD-10-CM | POA: Diagnosis not present

## 2021-12-26 DIAGNOSIS — Z79899 Other long term (current) drug therapy: Secondary | ICD-10-CM | POA: Insufficient documentation

## 2021-12-26 DIAGNOSIS — Z8249 Family history of ischemic heart disease and other diseases of the circulatory system: Secondary | ICD-10-CM | POA: Insufficient documentation

## 2021-12-26 DIAGNOSIS — K589 Irritable bowel syndrome without diarrhea: Secondary | ICD-10-CM | POA: Diagnosis not present

## 2021-12-26 DIAGNOSIS — K219 Gastro-esophageal reflux disease without esophagitis: Secondary | ICD-10-CM | POA: Insufficient documentation

## 2021-12-26 LAB — CBC WITH DIFFERENTIAL (CANCER CENTER ONLY)
Abs Immature Granulocytes: 0.01 10*3/uL (ref 0.00–0.07)
Basophils Absolute: 0 10*3/uL (ref 0.0–0.1)
Basophils Relative: 1 %
Eosinophils Absolute: 0 10*3/uL (ref 0.0–0.5)
Eosinophils Relative: 1 %
HCT: 37.3 % (ref 36.0–46.0)
Hemoglobin: 11.8 g/dL — ABNORMAL LOW (ref 12.0–15.0)
Immature Granulocytes: 0 %
Lymphocytes Relative: 28 %
Lymphs Abs: 1 10*3/uL (ref 0.7–4.0)
MCH: 27.1 pg (ref 26.0–34.0)
MCHC: 31.6 g/dL (ref 30.0–36.0)
MCV: 85.6 fL (ref 80.0–100.0)
Monocytes Absolute: 0.4 10*3/uL (ref 0.1–1.0)
Monocytes Relative: 10 %
Neutro Abs: 2.2 10*3/uL (ref 1.7–7.7)
Neutrophils Relative %: 60 %
Platelet Count: 198 10*3/uL (ref 150–400)
RBC: 4.36 MIL/uL (ref 3.87–5.11)
RDW: 14 % (ref 11.5–15.5)
WBC Count: 3.6 10*3/uL — ABNORMAL LOW (ref 4.0–10.5)
nRBC: 0 % (ref 0.0–0.2)

## 2021-12-26 LAB — FOLATE: Folate: >40 ng/mL (ref >5.9)

## 2021-12-26 LAB — CMP (CANCER CENTER ONLY)
ALT: 19 U/L (ref 0–44)
AST: 24 U/L (ref 15–41)
Albumin: 4.6 g/dL (ref 3.5–5.0)
Alkaline Phosphatase: 76 U/L (ref 38–126)
Anion gap: 6 (ref 5–15)
BUN: 12 mg/dL (ref 6–20)
CO2: 33 mmol/L — ABNORMAL HIGH (ref 22–32)
Calcium: 10.1 mg/dL (ref 8.9–10.3)
Chloride: 101 mmol/L (ref 98–111)
Creatinine: 0.89 mg/dL (ref 0.44–1.00)
GFR, Estimated: >60 mL/min (ref >60)
Glucose, Bld: 85 mg/dL (ref 70–99)
Potassium: 3.8 mmol/L (ref 3.5–5.1)
Sodium: 140 mmol/L (ref 135–145)
Total Bilirubin: 1.1 mg/dL (ref 0.3–1.2)
Total Protein: 7.6 g/dL (ref 6.5–8.1)

## 2021-12-26 LAB — HEPATITIS PANEL, ACUTE
HCV Ab: NONREACTIVE
Hep A IgM: NONREACTIVE
Hep B C IgM: NONREACTIVE
Hepatitis B Surface Ag: NONREACTIVE

## 2021-12-26 LAB — HIV ANTIBODY (ROUTINE TESTING W REFLEX): HIV Screen 4th Generation wRfx: NONREACTIVE

## 2021-12-26 LAB — TSH: TSH: 5.008 u[IU]/mL — ABNORMAL HIGH (ref 0.350–4.500)

## 2021-12-26 LAB — LACTATE DEHYDROGENASE: LDH: 142 U/L (ref 98–192)

## 2021-12-26 LAB — VITAMIN B12: Vitamin B-12: 449 pg/mL (ref 180–914)

## 2021-12-26 NOTE — Progress Notes (Signed)
[place] [phone]   [phone]  CONSULT NOTE  REFERRING PHYSICIAN: Dr. Donald Prose  REASON FOR CONSULTATION:  61 years old African-American female with leukocytopenia and anemia  HPI [name] is a 61 y.o. female with past medical history significant for uterine fibroids, GERD, irritable bowel syndrome as well as urinary incontinence.  The [facility] but her blood work showed that she had mild anemia and they declined her donation.  She was seen by her primary care physician and she had CBC on 12/03/2021 that showed hemoglobin of 11.6 and hematocrit 36.0%.  She had low white blood count of 2.8 with normal platelets count of 205.  The patient had iron studies that showed normal serum iron of 104 with iron saturation of 25% but no ferritin level.  Her TSH was normal.  She was referred to me today for further evaluation and recommendation regarding her condition.  The patient had history of leukocytopenia dated back to July 14, 2009 where her white blood count was 3.3.  She also had mild anemia at that time was 11.5 and hematocrit 34.9%. When seen today she is feeling fine with no concerning complaints.  She denied having any fatigue or weakness.  She has some occasional dizzy spells but no shortness of breath, chest pain, cough or hemoptysis.  She has history of acid reflux and she has been trying to modify her diet to avoid taking Nexium at regular basis.  She does not eat a lot of red meat.  She eats fish and chicken for protein.  She denied having any bleeding, bruises or ecchymosis. Family history significant for mother brother and sister with hypertension.  Father has unknown history. The patient is married and has 2 sons.  She is a retired [employer].  She has no history of smoking, alcohol or drug abuse.  HPI  Past Medical History:  Diagnosis Date   Breakthrough bleeding 2001   Fibroids, submucosal     GERD (gastroesophageal reflux disease)    H/O carpal tunnel syndrome    H/O fatigue    H/O menorrhagia    Hx of thyroid nodule    Hx: UTI (urinary tract infection)    Irritable bowel disease    Mastalgia 2010   Postcoital bleeding 2012   Urinary incontinence, mixed 2009   Vaginal atrophy    Varicose veins    Yeast infection     Past Surgical History:  Procedure Laterality Date    S/P bilateral tubal ligation  1998   carpal tunnel repair  right   DILATION AND CURETTAGE OF UTERUS  07/14/2009   HAMMER TOE SURGERY  1996   HYSTEROSCOPY  07/14/2009   INTRAUTERINE DEVICE INSERTION     NOVASURE ABLATION  07/14/2009   THYROIDECTOMY, PARTIAL     TUBAL LIGATION  1998   bilateral    Family History  Problem Relation Age of Onset   Hypertension Mother    Hypertension Sister    Hypertension Brother    Hypertension Sister    Hypertension Brother     Social History Social History   Tobacco Use   Smoking status: Never   Smokeless tobacco: Never  Substance Use Topics   Alcohol use: No   Drug use: No    Allergies  Allergen Reactions   Nitrofurantoin Macrocrystal     Other reaction(s): tachycardia   Octacosanol Itching and Other (See Comments)    TREES,  GRASS  PT HAS ITCHY TROAT AND SNEEZE   Codeine Other (See Comments)    Headache     Current Outpatient Medications  Medication Sig Dispense Refill   Ascorbic Acid (VITAMIN C) 100 MG tablet Take 100 mg by mouth daily.     Biotin 10 MG TABS Take by mouth.     cetirizine (ZYRTEC) 10 MG tablet Take 10 mg by mouth daily.     Cranberry 500 MG CAPS Take by mouth.     Docosahexaenoic Acid (DHA OMEGA 3 PO) Take by mouth.     esomeprazole (NEXIUM) 40 MG capsule Take 40 mg by mouth as needed.     estradiol (ESTRACE VAGINAL) 0.1 MG/GM vaginal cream See admin instructions.     ferrous sulfate 325 (65 FE) MG tablet Take 325 mg by mouth daily with breakfast.     Multiple Vitamin (MULTIVITAMIN) tablet Take 1 tablet by mouth daily.      Probiotic Product (PROBIOTIC DAILY PO) Take by mouth.     Multiple Vitamins-Minerals (AIRBORNE) CHEW See admin instructions.     No current facility-administered medications for this visit.    Review of Systems  Constitutional: negative Eyes: negative Ears, nose, mouth, throat, and face: negative Respiratory: negative Cardiovascular: negative Gastrointestinal: positive for dyspepsia Genitourinary:negative Integument/breast: negative Hematologic/lymphatic: negative Musculoskeletal:negative Neurological: negative Behavioral/Psych: negative Endocrine: negative Allergic/Immunologic: negative  Physical Exam  HEN:IDPOE, healthy, no distress, well nourished, and well developed SKIN: skin color, texture, turgor are normal, no rashes or significant lesions HEAD: Normocephalic, No masses, lesions, tenderness or abnormalities EYES: normal, PERRLA, Conjunctiva are pink and non-injected EARS: External ears normal, Canals clear OROPHARYNX:no exudate, no erythema, and lips, buccal mucosa, and tongue normal  NECK: supple, no adenopathy, no JVD LYMPH:  no palpable lymphadenopathy, no hepatosplenomegaly BREAST:not examined LUNGS: clear to auscultation , and palpation HEART: regular rate & rhythm, no murmurs, and no gallops ABDOMEN:abdomen soft, non-tender, normal bowel sounds, and no masses or organomegaly BACK: Back symmetric, no curvature., No CVA tenderness EXTREMITIES:no joint deformities, effusion, or inflammation, no edema  NEURO: alert & oriented x 3 with fluent speech, no focal motor/sensory deficits  PERFORMANCE STATUS: ECOG 1  LABORATORY DATA: Lab Results  Component Value Date   WBC 3.6 (L) 12/26/2021   HGB 11.8 (L) 12/26/2021   HCT 37.3 12/26/2021   MCV 85.6 12/26/2021   PLT 198 12/26/2021      Chemistry      Component Value Date/Time   NA 140 12/26/2021 1113   K 3.8 12/26/2021 1113   CL 101 12/26/2021 1113   CO2 33 (H) 12/26/2021 1113   BUN 12 12/26/2021 1113    CREATININE 0.89 12/26/2021 1113      Component Value Date/Time   CALCIUM 10.1 12/26/2021 1113   ALKPHOS 76 12/26/2021 1113   AST 24 12/26/2021 1113   ALT 19 12/26/2021 1113   BILITOT 1.1 12/26/2021 1113       RADIOGRAPHIC STUDIES: No results found.  ASSESSMENT: This is a very pleasant 60 years old African-American female with mild leukocytopenia and anemia of unclear etiology.  The patient had similar problems that has been going on for several years.   PLAN: I had a lengthy discussion with the patient today about her condition and further investigation to identify the etiology of her anemia and leukocytopenia. I ordered several blood work today including repeat CBC that showed mild leukocytopenia with total white blood count of 3.6.  Her hemoglobin was up to 11.8 and hematocrit normal at 37.3%  with normal platelets count of 051,071.,  His metabolic panel and LDH are unremarkable.  I order other studies including vitamin B12, serum folate, HIV, hepatitis panel as well as ANA and rheumatoid factor. These labs are still pending. I recommended for the patient to continue on observation for now and I will repeat her CBC in around 3 months from now and if she has further decline in her blood count, I may consider her for bone marrow biopsy and aspirate to rule out any other bone marrow abnormality. The patient was advised to call immediately if she has any other concerning symptoms in the interval. The patient voices understanding of current disease status and treatment options and is in agreement with the current care plan.  All questions were answered. The patient knows to call the clinic with any problems, questions or concerns. We can certainly see the patient much sooner if necessary.  Thank you so much for allowing me to participate in the care of [name]. I will continue to follow up the patient with you and assist in her care.  The total time spent in the appointment was 60  minutes.  Disclaimer: This note was dictated with voice recognition software. Similar sounding words can inadvertently be transcribed and may not be corrected upon review.   Eilleen Kempf December 26, 2021, 12:10 PM

## 2021-12-27 LAB — RHEUMATOID FACTOR: Rheumatoid fact SerPl-aCnc: <10 IU/mL (ref <14.0)

## 2021-12-27 LAB — ANTINUCLEAR ANTIBODIES, IFA: ANA Ab, IFA: NEGATIVE

## 2022-02-25 DIAGNOSIS — N6310 Unspecified lump in the right breast, unspecified quadrant: Secondary | ICD-10-CM | POA: Insufficient documentation

## 2022-03-28 ENCOUNTER — Inpatient Hospital Stay (HOSPITAL_BASED_OUTPATIENT_CLINIC_OR_DEPARTMENT_OTHER): Payer: POS | Admitting: Internal Medicine

## 2022-03-28 ENCOUNTER — Other Ambulatory Visit: Payer: Self-pay | Admitting: *Deleted

## 2022-03-28 ENCOUNTER — Other Ambulatory Visit: Payer: Self-pay

## 2022-03-28 ENCOUNTER — Inpatient Hospital Stay: Payer: POS | Attending: Internal Medicine

## 2022-03-28 VITALS — BP 112/71 | HR 77 | Temp 97.7°F | Resp 15 | Wt 140.1 lb

## 2022-03-28 DIAGNOSIS — Z87440 Personal history of urinary (tract) infections: Secondary | ICD-10-CM | POA: Insufficient documentation

## 2022-03-28 DIAGNOSIS — D649 Anemia, unspecified: Secondary | ICD-10-CM | POA: Diagnosis present

## 2022-03-28 DIAGNOSIS — D72819 Decreased white blood cell count, unspecified: Secondary | ICD-10-CM

## 2022-03-28 DIAGNOSIS — Z79899 Other long term (current) drug therapy: Secondary | ICD-10-CM | POA: Insufficient documentation

## 2022-03-28 DIAGNOSIS — Z885 Allergy status to narcotic agent status: Secondary | ICD-10-CM | POA: Diagnosis not present

## 2022-03-28 DIAGNOSIS — I8393 Asymptomatic varicose veins of bilateral lower extremities: Secondary | ICD-10-CM

## 2022-03-28 LAB — CBC WITH DIFFERENTIAL (CANCER CENTER ONLY)
Abs Immature Granulocytes: 0 10*3/uL (ref 0.00–0.07)
Basophils Absolute: 0 10*3/uL (ref 0.0–0.1)
Basophils Relative: 0 %
Eosinophils Absolute: 0 10*3/uL (ref 0.0–0.5)
Eosinophils Relative: 1 %
HCT: 35.6 % — ABNORMAL LOW (ref 36.0–46.0)
Hemoglobin: 11.5 g/dL — ABNORMAL LOW (ref 12.0–15.0)
Immature Granulocytes: 0 %
Lymphocytes Relative: 32 %
Lymphs Abs: 1.1 10*3/uL (ref 0.7–4.0)
MCH: 27.6 pg (ref 26.0–34.0)
MCHC: 32.3 g/dL (ref 30.0–36.0)
MCV: 85.4 fL (ref 80.0–100.0)
Monocytes Absolute: 0.4 10*3/uL (ref 0.1–1.0)
Monocytes Relative: 12 %
Neutro Abs: 1.9 10*3/uL (ref 1.7–7.7)
Neutrophils Relative %: 55 %
Platelet Count: 207 10*3/uL (ref 150–400)
RBC: 4.17 MIL/uL (ref 3.87–5.11)
RDW: 13.6 % (ref 11.5–15.5)
WBC Count: 3.4 10*3/uL — ABNORMAL LOW (ref 4.0–10.5)
nRBC: 0 % (ref 0.0–0.2)

## 2022-03-28 LAB — CMP (CANCER CENTER ONLY)
ALT: 16 U/L (ref 0–44)
AST: 23 U/L (ref 15–41)
Albumin: 4.3 g/dL (ref 3.5–5.0)
Alkaline Phosphatase: 76 U/L (ref 38–126)
Anion gap: 6 (ref 5–15)
BUN: 11 mg/dL (ref 6–20)
CO2: 32 mmol/L (ref 22–32)
Calcium: 9.7 mg/dL (ref 8.9–10.3)
Chloride: 102 mmol/L (ref 98–111)
Creatinine: 0.89 mg/dL (ref 0.44–1.00)
GFR, Estimated: >60 mL/min (ref >60)
Glucose, Bld: 82 mg/dL (ref 70–99)
Potassium: 3.8 mmol/L (ref 3.5–5.1)
Sodium: 140 mmol/L (ref 135–145)
Total Bilirubin: 0.9 mg/dL (ref 0.3–1.2)
Total Protein: 7.1 g/dL (ref 6.5–8.1)

## 2022-03-28 NOTE — Progress Notes (Signed)
[place] [phone]   [phone]) [phone]  OFFICE PROGRESS NOTE  Donald Prose, MD [place] [address]  DIAGNOSIS: mild leukocytopenia and anemia of unclear etiology.  The patient had similar problems that has been going on for several years.  PRIOR THERAPY: None  CURRENT THERAPY: Observation  INTERVAL HISTORY: [name] 61 y.o. female returns to the clinic today for follow-up visit.  The patient is feeling fine today with no concerning complaints.  She denied having any chest pain, shortness of breath, cough or hemoptysis.  She has no nausea, vomiting, diarrhea or constipation.  She has no headache or visual changes.  She had a mammogram recently and there was some abnormal finding and she is currently on a 59-month schedule followed by her primary care physician.  She had extensive blood work for evaluation of leukocytopenia and anemia few months ago that were unremarkable.  The patient is here today for evaluation and repeat blood work.  MEDICAL HISTORY: Past Medical History:  Diagnosis Date   Breakthrough bleeding 2001   Fibroids, submucosal    GERD (gastroesophageal reflux disease)    H/O carpal tunnel syndrome    H/O fatigue    H/O menorrhagia    Hx of thyroid nodule    Hx: UTI (urinary tract infection)    Irritable bowel disease    Mastalgia 2010   Postcoital bleeding 2012   Urinary incontinence, mixed 2009   Vaginal atrophy    Varicose veins    Yeast infection     ALLERGIES:  is allergic to nitrofurantoin macrocrystal, octacosanol, and codeine.  MEDICATIONS:  Current Outpatient Medications  Medication Sig Dispense Refill   Ascorbic Acid (VITAMIN C) 100 MG tablet Take 100 mg by mouth daily.     Biotin 10 MG TABS Take by mouth.     cetirizine (ZYRTEC) 10 MG tablet Take 10 mg by mouth daily.     Cranberry 500 MG CAPS Take by mouth.     Docosahexaenoic Acid (DHA OMEGA 3 PO) Take by mouth.      esomeprazole (NEXIUM) 40 MG capsule Take 40 mg by mouth as needed.     estradiol (ESTRACE VAGINAL) 0.1 MG/GM vaginal cream See admin instructions.     ferrous sulfate 325 (65 FE) MG tablet Take 325 mg by mouth daily with breakfast.     Multiple Vitamin (MULTIVITAMIN) tablet Take 1 tablet by mouth daily.     Multiple Vitamins-Minerals (AIRBORNE) CHEW See admin instructions.     Probiotic Product (PROBIOTIC DAILY PO) Take by mouth.     No current facility-administered medications for this visit.    SURGICAL HISTORY:  Past Surgical History:  Procedure Laterality Date    S/P bilateral tubal ligation  1998   carpal tunnel repair  right   DILATION AND CURETTAGE OF UTERUS  07/14/2009   HAMMER TOE SURGERY  1996   HYSTEROSCOPY  07/14/2009   INTRAUTERINE DEVICE INSERTION     NOVASURE ABLATION  07/14/2009   THYROIDECTOMY, PARTIAL     TUBAL LIGATION  1998   bilateral    REVIEW OF SYSTEMS:  A comprehensive review of systems was negative.   PHYSICAL EXAMINATION: General appearance: alert, cooperative, and no distress Head: Normocephalic, without obvious abnormality, atraumatic Neck: no adenopathy, no JVD, supple, symmetrical, trachea midline, and thyroid not enlarged, symmetric, no tenderness/mass/nodules Lymph nodes: Cervical, supraclavicular, and axillary nodes normal. Resp: clear to auscultation bilaterally Back: symmetric, no curvature. ROM  normal. No CVA tenderness. Cardio: regular rate and rhythm, S1, S2 normal, no murmur, click, rub or gallop GI: soft, non-tender; bowel sounds normal; no masses,  no organomegaly Extremities: extremities normal, atraumatic, no cyanosis or edema  ECOG PERFORMANCE STATUS: 0 - Asymptomatic  Blood pressure 112/71, pulse 77, temperature 97.7 F (36.5 C), temperature source Oral, resp. rate 15, weight 140 lb 1.6 oz (63.5 kg), SpO2 100 %.  LABORATORY DATA: Lab Results  Component Value Date   WBC 3.6 (L) 12/26/2021   HGB 11.8 (L) 12/26/2021   HCT 37.3  12/26/2021   MCV 85.6 12/26/2021   PLT 198 12/26/2021      Chemistry      Component Value Date/Time   NA 140 12/26/2021 1113   K 3.8 12/26/2021 1113   CL 101 12/26/2021 1113   CO2 33 (H) 12/26/2021 1113   BUN 12 12/26/2021 1113   CREATININE 0.89 12/26/2021 1113      Component Value Date/Time   CALCIUM 10.1 12/26/2021 1113   ALKPHOS 76 12/26/2021 1113   AST 24 12/26/2021 1113   ALT 19 12/26/2021 1113   BILITOT 1.1 12/26/2021 1113       RADIOGRAPHIC STUDIES: No results found.  ASSESSMENT AND PLAN: This is a very pleasant 61 years old African-American female with mild leukocytopenia and anemia of unclear etiology. The patient had extensive blood work few months ago that was unremarkable for any abnormality. I discussed with her the option of proceeding with a bone marrow biopsy and aspirate to rule out any bone marrow abnormalities and she is in agreement with this option. I will send her to interventional radiology for the biopsy. I will see her back for follow-up visit in 1 months for evaluation and repeat blood work and discussion of her biopsy results. The patient was advised to call immediately if she has any other concerning symptoms in the interval. The patient voices understanding of current disease status and treatment options and is in agreement with the current care plan.  All questions were answered. The patient knows to call the clinic with any problems, questions or concerns. We can certainly see the patient much sooner if necessary.  The total time spent in the appointment was 20 minutes.  Disclaimer: This note was dictated with voice recognition software. Similar sounding words can inadvertently be transcribed and may not be corrected upon review.

## 2022-04-19 ENCOUNTER — Ambulatory Visit: Payer: POS | Admitting: Physician Assistant

## 2022-04-19 ENCOUNTER — Encounter: Payer: Self-pay | Admitting: Physician Assistant

## 2022-04-19 ENCOUNTER — Ambulatory Visit (HOSPITAL_COMMUNITY)
Admission: RE | Admit: 2022-04-19 | Discharge: 2022-04-19 | Disposition: A | Discharge status: Home / Self Care | Payer: POS | Source: Ambulatory Visit | Attending: Vascular Surgery | Admitting: Vascular Surgery

## 2022-04-19 VITALS — BP 100/54 | HR 64 | Temp 97.0°F | Resp 16 | Ht 67.0 in | Wt 138.5 lb

## 2022-04-19 DIAGNOSIS — I8393 Asymptomatic varicose veins of bilateral lower extremities: Secondary | ICD-10-CM | POA: Insufficient documentation

## 2022-04-19 DIAGNOSIS — I781 Nevus, non-neoplastic: Secondary | ICD-10-CM | POA: Diagnosis not present

## 2022-04-19 NOTE — Progress Notes (Signed)
[facility]      History of Present Illness  [name] is a 61 y.o. female who presents with chief complaint: right cal tightness.  She has been seen in the past for spider veins ans sclero therapy.  She has a history of mild reflux B and worn thigh high compression in the past.  She recently retired and got rid of her compression.  She states the calf tightness started after she stopped the compression.  She denies claudication, rest pain and non healing wounds.  She is not as active as she was when she was working.  She states she has developed more spider veins.  She would like to see about sclero therapy again.    Past Medical History:  Diagnosis Date   Breakthrough bleeding 2001   Fibroids, submucosal    GERD (gastroesophageal reflux disease)    H/O carpal tunnel syndrome    H/O fatigue    H/O menorrhagia    Hx of thyroid nodule    Hx: UTI (urinary tract infection)    Irritable bowel disease    Mastalgia 2010   Postcoital bleeding 2012   Urinary incontinence, mixed 2009   Vaginal atrophy    Varicose veins    Yeast infection     Past Surgical History:  Procedure Laterality Date    S/P bilateral tubal ligation  1998   carpal tunnel repair  right   DILATION AND CURETTAGE OF UTERUS  07/14/2009   HAMMER TOE SURGERY  1996   HYSTEROSCOPY  07/14/2009   INTRAUTERINE DEVICE INSERTION     NOVASURE ABLATION  07/14/2009   THYROIDECTOMY, PARTIAL     TUBAL LIGATION  1998   bilateral    Social History   Socioeconomic History   Marital status: Married    Spouse name: Not on file   Number of children: Not on file   Years of education: Not on file   Highest education level: Not on file  Occupational History   Not on file  Tobacco Use   Smoking status: Never   Smokeless tobacco: Never  Substance and Sexual Activity   Alcohol use: No   Drug use: No   Sexual activity: Yes    Partners: Male    Birth control/protection: Other-see comments     Comment: BTL  Other Topics Concern   Not on file  Social History Narrative   Not on file   Social Determinants of Health   Financial Resource Strain: Not on file  Food Insecurity: Not on file  Transportation Needs: Not on file  Physical Activity: Not on file  Stress: Not on file  Social Connections: Not on file  Intimate Partner Violence: Not on file    Family History  Problem Relation Age of Onset   Hypertension Mother    Hypertension Sister    Hypertension Brother    Hypertension Sister    Hypertension Brother     Current Outpatient Medications on File Prior to Visit  Medication Sig Dispense Refill   Ascorbic Acid (VITAMIN C) 100 MG tablet Take 100 mg by mouth daily.     Biotin 10 MG TABS Take by mouth.     cetirizine (ZYRTEC) 10 MG tablet Take 10 mg by mouth daily.     Cranberry 500 MG CAPS Take by mouth.     Docosahexaenoic Acid (DHA OMEGA 3 PO) Take by mouth.     esomeprazole (NEXIUM) 40 MG capsule Take 40 mg by mouth as  needed.     estradiol (ESTRACE VAGINAL) 0.1 MG/GM vaginal cream See admin instructions.     ferrous sulfate 325 (65 FE) MG tablet Take 325 mg by mouth daily with breakfast.     Multiple Vitamin (MULTIVITAMIN) tablet Take 1 tablet by mouth daily.     Multiple Vitamins-Minerals (AIRBORNE) CHEW See admin instructions.     Probiotic Product (PROBIOTIC DAILY PO) Take by mouth.     No current facility-administered medications on file prior to visit.    Allergies as of 04/19/2022 - Review Complete 04/19/2022  Allergen Reaction Noted   Nitrofurantoin macrocrystal  01/24/2016   Octacosanol Itching and Other (See Comments) 09/23/2011   Codeine Other (See Comments) 01/27/2017     ROS:   General:  No weight loss, Fever, chills  HEENT: No recent headaches, no nasal bleeding, no visual changes, no sore throat  Neurologic: No dizziness, blackouts, seizures. No recent symptoms of stroke or mini- stroke. No recent episodes of slurred speech, or temporary  blindness.  Cardiac: No recent episodes of chest pain/pressure, no shortness of breath at rest.  No shortness of breath with exertion.  Denies history of atrial fibrillation or irregular heartbeat  Vascular: No history of rest pain in feet.  No history of claudication.  No history of non-healing ulcer, No history of DVT   Pulmonary: No home oxygen, no productive cough, no hemoptysis,  No asthma or wheezing  Musculoskeletal:  '[ ]'$  Arthritis, '[ ]'$  Low back pain,  '[ ]'$  Joint pain  Hematologic:No history of hypercoagulable state.  No history of easy bleeding.  No history of anemia  Gastrointestinal: No hematochezia or melena,  No gastroesophageal reflux, no trouble swallowing  Urinary: '[ ]'$  chronic Kidney disease, '[ ]'$  on HD - '[ ]'$  MWF or '[ ]'$  TTHS, '[ ]'$  Burning with urination, '[ ]'$  Frequent urination, '[ ]'$  Difficulty urinating;   Skin: No rashes  Psychological: No history of anxiety,  No history of depression  Physical Examination  Vitals:   04/19/22 1220  BP: (!) 100/54  Pulse: 64  Resp: 16  Temp: (!) 97 F (36.1 C)  TempSrc: Temporal  SpO2: 99%  Weight: 138 lb 8 oz (62.8 kg)  Height: '5\' 7"'$  (1.702 m)    Body mass index is 21.69 kg/m.  General:  Alert and oriented, no acute distress HEENT: Normal Neck: No bruit or JVD Pulmonary: Clear to auscultation bilaterally Cardiac: Regular Rate and Rhythm without murmur Abdomen: Soft, non-tender, non-distended, no mass, no scars Skin: No rash Extremity Pulses:  2+ radial,  femoral, dorsalis pedis, posterior tibial pulses bilaterally Musculoskeletal: No deformity or edema On exam today Neurologic: Upper and lower extremity motor 5/5 and symmetric  DATA: Venous Reflux Times  +------------------+---------+------+-----------+------------+--------+  RIGHT            Reflux NoRefluxReflux TimeDiameter cmsComments                              Yes                                    +------------------+---------+------+-----------+------------+--------+  CFV              no                                              +------------------+---------+------+-----------+------------+--------+  FV mid            no                                              +------------------+---------+------+-----------+------------+--------+  Popliteal        no                                              +------------------+---------+------+-----------+------------+--------+  GSV at [facility]        no                            0.50              +------------------+---------+------+-----------+------------+--------+  GSV prox thigh    no                            0.23              +------------------+---------+------+-----------+------------+--------+  GSV mid thigh     no                            0.28              +------------------+---------+------+-----------+------------+--------+  GSV dist thigh    no                            0.15              +------------------+---------+------+-----------+------------+--------+  SSV Pop Fossa     no                            0.20              +------------------+---------+------+-----------+------------+--------+  anterior accessory          yes    >500 ms      0.37              +------------------+---------+------+-----------+------------+--------+   Summary:  Right:  - No evidence of deep vein thrombosis from the common femoral through the  popliteal veins.  - No evidence of superficial venous thrombosis.  - The deep venous system is competent.  - The great and small saphenous veins are competent.  - The anterior accessory branch is not competent.      Assessment/Plan: Telangectasia B LE Mild reflux only in anterior accessory branch No SFJ or GSV reflux.  No DVT. She has palpable  pedal pulse and is not at risk of limb loss.   She was measured for new compression,  but will get them later.  Izora Gala our RN will call her for consultation to treat her spider veins with sclero therapy    Roxy Horseman PA-C [facility] [place] Office: [phone]  MD in clinic [place]

## 2022-04-25 ENCOUNTER — Telehealth: Payer: Self-pay | Admitting: Internal Medicine

## 2022-04-25 NOTE — Telephone Encounter (Signed)
Rescheduled 11/28 appointment to 11/21 due to provider pal, patient has been called and voicemail was left.

## 2022-04-26 ENCOUNTER — Other Ambulatory Visit: Payer: Self-pay | Admitting: Internal Medicine

## 2022-04-26 ENCOUNTER — Telehealth: Payer: Self-pay

## 2022-04-26 DIAGNOSIS — D72819 Decreased white blood cell count, unspecified: Secondary | ICD-10-CM

## 2022-04-26 NOTE — H&P (Signed)
Chief Complaint: Patient was seen in consultation today for leukocytopenia and anemia at the request of [facility]  Referring Physician(s): Mohamed,Mohamed  Supervising Physician: Michaelle Birks  Patient Status: [facility] - Out-pt  History of Present Illness: [name] is a 61 y.o. female with PMH significant for uterine fibroids, GERD, IBS and urinary incontinence. Pt was referred to hematology after [facility] and was found to have mild anemia. CBC from PCP 12/03/21 mild anemia and leukocytopenia with normal platelet count. She has been referred to IR for further workup to include bone marrow biopsy to rule out bone marrow abnormalities.    Pt denies fever, chills, CP, SOB, N/V, weakness or fatigue.  She states that she is really nervous about procedure.  She is NPO per order.   Past Medical History:  Diagnosis Date   Breakthrough bleeding 2001   Fibroids, submucosal    GERD (gastroesophageal reflux disease)    H/O carpal tunnel syndrome    H/O fatigue    H/O menorrhagia    Hx of thyroid nodule    Hx: UTI (urinary tract infection)    Irritable bowel disease    Mastalgia 2010   Postcoital bleeding 2012   Urinary incontinence, mixed 2009   Vaginal atrophy    Varicose veins    Yeast infection     Past Surgical History:  Procedure Laterality Date    S/P bilateral tubal ligation  1998   carpal tunnel repair  right   DILATION AND CURETTAGE OF UTERUS  07/14/2009   HAMMER TOE SURGERY  1996   HYSTEROSCOPY  07/14/2009   INTRAUTERINE DEVICE INSERTION     NOVASURE ABLATION  07/14/2009   THYROIDECTOMY, PARTIAL     TUBAL LIGATION  1998   bilateral    Allergies: Nitrofurantoin macrocrystal, Octacosanol, and Codeine  Medications: Prior to Admission medications   Medication Sig Start Date End Date Taking? Authorizing Provider  Ascorbic Acid (VITAMIN C) 100 MG tablet Take 100 mg by mouth daily.    [provider]  Biotin 10 MG  TABS Take by mouth.    [provider]  cetirizine (ZYRTEC) 10 MG tablet Take 10 mg by mouth daily.    [provider]  Cranberry 500 MG CAPS Take by mouth.    [provider]  Docosahexaenoic Acid (DHA OMEGA 3 PO) Take by mouth.    [provider]  esomeprazole (NEXIUM) 40 MG capsule Take 40 mg by mouth as needed.    [provider]  estradiol (ESTRACE VAGINAL) 0.1 MG/GM vaginal cream See admin instructions. 10/09/16   [provider]  ferrous sulfate 325 (65 FE) MG tablet Take 325 mg by mouth daily with breakfast.    [provider]  Multiple Vitamin (MULTIVITAMIN) tablet Take 1 tablet by mouth daily.    [provider]  Multiple Vitamins-Minerals (AIRBORNE) CHEW See admin instructions.    [provider]  Probiotic Product (PROBIOTIC DAILY PO) Take by mouth.    [provider]     Family History  Problem Relation Age of Onset   Hypertension Mother    Hypertension Sister    Hypertension Brother    Hypertension Sister    Hypertension Brother     Social History   Socioeconomic History   Marital status: Married    Spouse name: Not on file   Number of children: Not on file   Years of education: Not on file   Highest education level:  Not on file  Occupational History   Not on file  Tobacco Use   Smoking status: Never   Smokeless tobacco: Never  Substance and Sexual Activity   Alcohol use: No   Drug use: No   Sexual activity: Yes    Partners: Male    Birth control/protection: Other-see comments    Comment: BTL  Other Topics Concern   Not on file  Social History Narrative   Not on file   Social Determinants of Health   Financial Resource Strain: Not on file  Food Insecurity: Not on file  Transportation Needs: Not on file  Physical Activity: Not on file  Stress: Not on file  Social Connections: Not on file     Review of Systems: A 12 point ROS discussed and pertinent positives  are indicated in the HPI above.  All other systems are negative.  Review of Systems  All other systems reviewed and are negative.   Vital Signs: BP 118/69   Pulse 73   Temp 98.2 F (36.8 C) (Oral)   Resp 16   Ht _0  (1.702 m)   Wt 138 lb 7.2 oz (62.8 kg)   SpO2 98%   BMI 21.68 kg/m     Physical Exam Vitals reviewed.  Constitutional:      General: She is not in acute distress.    Appearance: Normal appearance. She is not ill-appearing.  HENT:     Head: Normocephalic and atraumatic.     Mouth/Throat:     Mouth: Mucous membranes are dry.     Pharynx: Oropharynx is clear.  Eyes:     Extraocular Movements: Extraocular movements intact.     Pupils: Pupils are equal, round, and reactive to light.  Cardiovascular:     Rate and Rhythm: Normal rate and regular rhythm.     Pulses: Normal pulses.     Heart sounds: Normal heart sounds. No murmur heard. Pulmonary:     Effort: Pulmonary effort is normal. No respiratory distress.     Breath sounds: Normal breath sounds.  Abdominal:     General: Bowel sounds are normal. There is no distension.     Palpations: Abdomen is soft.     Tenderness: There is no abdominal tenderness. There is no guarding.  Musculoskeletal:     Right lower leg: No edema.     Left lower leg: No edema.  Skin:    General: Skin is warm and dry.  Neurological:     Mental Status: She is alert and oriented to person, place, and time.  Psychiatric:        Mood and Affect: Mood normal.        Behavior: Behavior normal.        Thought Content: Thought content normal.        Judgment: Judgment normal.     Imaging: VAS [place] LOWER EXTREMITY VENOUS REFLUX  Result Date: 04/19/2022  Lower Venous Reflux Study Patient Name:  [name]  Date of Exam:   04/19/2022 Medical Rec #: [id number]           Accession #:    [id number] Date of Birth: 10-17-1960          Patient Gender: F Patient Age:   39 years Exam Location:  [facility] Procedure:       VAS [place] LOWER EXTREMITY VENOUS REFLUX Referring Phys: Aldona Bar RHYNE --------------------------------------------------------------------------------  Indications: Spider veins and tightness around the calf and behind knee.  Performing Technologist: Danae Chen  Mcgonigal RVS, RCS  Examination Guidelines: A complete evaluation includes B-mode imaging, spectral Doppler, color Doppler, and power Doppler as needed of all accessible portions of each vessel. Bilateral testing is considered an integral part of a complete examination. Limited examinations for reoccurring indications may be performed as noted. The reflux portion of the exam is performed with the patient in reverse Trendelenburg. Significant venous reflux is defined as >500 ms in the superficial venous system, and >1 second in the deep venous system.  Venous Reflux Times +------------------+---------+------+-----------+------------+--------+ RIGHT             Reflux NoRefluxReflux TimeDiameter cmsComments                             Yes                                  +------------------+---------+------+-----------+------------+--------+ CFV               no                                             +------------------+---------+------+-----------+------------+--------+ FV mid            no                                             +------------------+---------+------+-----------+------------+--------+ Popliteal         no                                             +------------------+---------+------+-----------+------------+--------+ GSV at [facility]        no                            0.50             +------------------+---------+------+-----------+------------+--------+ GSV prox thigh    no                            0.23             +------------------+---------+------+-----------+------------+--------+ GSV mid thigh     no                            0.28              +------------------+---------+------+-----------+------------+--------+ GSV dist thigh    no                            0.15             +------------------+---------+------+-----------+------------+--------+ SSV Pop Fossa     no                            0.20             +------------------+---------+------+-----------+------------+--------+ anterior accessory  yes    >500 ms      0.37             +------------------+---------+------+-----------+------------+--------+   Summary: Right: - No evidence of deep vein thrombosis from the common femoral through the popliteal veins. - No evidence of superficial venous thrombosis. - The deep venous system is competent. - The great and small saphenous veins are competent. - The anterior accessory branch is not competent.  *See table(s) above for measurements and observations. Electronically signed by Orlie Pollen on 04/19/2022 at 4:46:11 PM.    Final     Labs:  CBC: Recent Labs    12/26/21 1113 03/28/22 1023  WBC 3.6* 3.4*  HGB 11.8* 11.5*  HCT 37.3 35.6*  PLT 198 207    COAGS: No results for input(s): "INR", "APTT" in the last 8760 hours.  BMP: Recent Labs    12/26/21 1113 03/28/22 1023  NA 140 140  K 3.8 3.8  CL 101 102  CO2 33* 32  GLUCOSE 85 82  BUN 12 11  CALCIUM 10.1 9.7  CREATININE 0.89 0.89  GFRNONAA >60 >60    LIVER FUNCTION TESTS: Recent Labs    12/26/21 1113 03/28/22 1023  BILITOT 1.1 0.9  AST 24 23  ALT 19 16  ALKPHOS 76 76  PROT 7.6 7.1  ALBUMIN 4.6 4.3    TUMOR MARKERS: No results for input(s): "AFPTM", "CEA", "CA199", "CHROMGRNA" in the last 8760 hours.  Assessment and Plan: 61 yo female presents to IR for bone marrow biopsy d/t leukopenia.   Pt resting on stretcher.  She is A&O, calm and pleasant.  She is in no distress.   Risks and benefits of bone marrow biopsy and aspiration with moderate sedation was discussed with the patient and/or patient's family including, but not  limited to bleeding, infection, damage to adjacent structures or low yield requiring additional tests.  All of the questions were answered and there is agreement to proceed.  Consent signed and in chart.  Thank you for this interesting consult.  I greatly enjoyed meeting Tyson L Sandeen and look forward to participating in their care.  A copy of this report was sent to the requesting provider on this date.  Electronically Signed: Tyson Alias, NP 04/29/2022, 10:12 AM   I spent a total of 20 minutes in face to face in clinical consultation, greater than 50% of which was counseling/coordinating care for leukocytopenia and anemia.

## 2022-04-26 NOTE — Telephone Encounter (Signed)
This nurse received a call from this patient who stated that she is wondering why she has an appointment on 11/20 and 11/21.  This nurse noticed that bone marrow biopsy is on 11/20 and office vist is on 11/21.  This nurse spoke with provider and was advised to inform patient that the office visit from 11/21 will be moved to the first week of December and our scheduling department will give her a call to schedule.  Advised that bone marrow results will not be back that soon.  Patient acknowledged understanding.  No further questions or concerns noted at this time.

## 2022-04-29 ENCOUNTER — Telehealth: Payer: Self-pay | Admitting: Physician Assistant

## 2022-04-29 ENCOUNTER — Encounter (HOSPITAL_COMMUNITY): Payer: Self-pay

## 2022-04-29 ENCOUNTER — Ambulatory Visit (HOSPITAL_COMMUNITY)
Admission: RE | Admit: 2022-04-29 | Discharge: 2022-04-29 | Disposition: A | Discharge status: Home / Self Care | Payer: POS | Source: Ambulatory Visit | Attending: Internal Medicine | Admitting: Internal Medicine

## 2022-04-29 ENCOUNTER — Other Ambulatory Visit: Payer: Self-pay

## 2022-04-29 DIAGNOSIS — K589 Irritable bowel syndrome without diarrhea: Secondary | ICD-10-CM | POA: Insufficient documentation

## 2022-04-29 DIAGNOSIS — K219 Gastro-esophageal reflux disease without esophagitis: Secondary | ICD-10-CM | POA: Diagnosis not present

## 2022-04-29 DIAGNOSIS — R32 Unspecified urinary incontinence: Secondary | ICD-10-CM | POA: Insufficient documentation

## 2022-04-29 DIAGNOSIS — D72819 Decreased white blood cell count, unspecified: Secondary | ICD-10-CM | POA: Insufficient documentation

## 2022-04-29 DIAGNOSIS — Z87440 Personal history of urinary (tract) infections: Secondary | ICD-10-CM | POA: Diagnosis not present

## 2022-04-29 DIAGNOSIS — Z86018 Personal history of other benign neoplasm: Secondary | ICD-10-CM | POA: Diagnosis not present

## 2022-04-29 DIAGNOSIS — D649 Anemia, unspecified: Secondary | ICD-10-CM | POA: Diagnosis not present

## 2022-04-29 LAB — CBC WITH DIFFERENTIAL/PLATELET
Abs Immature Granulocytes: 0.01 10*3/uL (ref 0.00–0.07)
Basophils Absolute: 0 10*3/uL (ref 0.0–0.1)
Basophils Relative: 1 %
Eosinophils Absolute: 0 10*3/uL (ref 0.0–0.5)
Eosinophils Relative: 1 %
HCT: 35.9 % — ABNORMAL LOW (ref 36.0–46.0)
Hemoglobin: 11.1 g/dL — ABNORMAL LOW (ref 12.0–15.0)
Immature Granulocytes: 0 %
Lymphocytes Relative: 28 %
Lymphs Abs: 1 10*3/uL (ref 0.7–4.0)
MCH: 27.4 pg (ref 26.0–34.0)
MCHC: 30.9 g/dL (ref 30.0–36.0)
MCV: 88.6 fL (ref 80.0–100.0)
Monocytes Absolute: 0.4 10*3/uL (ref 0.1–1.0)
Monocytes Relative: 12 %
Neutro Abs: 2.1 10*3/uL (ref 1.7–7.7)
Neutrophils Relative %: 58 %
Platelets: 192 10*3/uL (ref 150–400)
RBC: 4.05 MIL/uL (ref 3.87–5.11)
RDW: 13.6 % (ref 11.5–15.5)
WBC: 3.5 10*3/uL — ABNORMAL LOW (ref 4.0–10.5)
nRBC: 0 % (ref 0.0–0.2)

## 2022-04-29 MED ORDER — MIDAZOLAM HCL 2 MG/2ML IJ SOLN
INTRAMUSCULAR | Status: AC
  Filled 2022-04-29: qty 4

## 2022-04-29 MED ORDER — SODIUM CHLORIDE 0.9 % IV SOLN: INTRAVENOUS | Status: DC

## 2022-04-29 MED ORDER — FENTANYL CITRATE (PF) 100 MCG/2ML IJ SOLN
INTRAMUSCULAR | Status: AC
  Filled 2022-04-29: qty 2

## 2022-04-29 NOTE — Telephone Encounter (Signed)
Per 11/20 in basket called and left message for pt about appointments

## 2022-04-29 NOTE — Discharge Instructions (Addendum)
Discharge Instructions:   Please call Interventional Radiology clinic [phone] with any questions or concerns.  You may remove your dressing and shower tomorrow.  Bone Marrow Aspiration and Bone Marrow Biopsy, Adult, Care After This sheet gives you information about how to care for yourself after your procedure. Your health care provider may also give you more specific instructions. If you have problems or questions, contact your health care provider. What can I expect after the procedure? After the procedure, it is common to have: Mild pain and tenderness. Swelling. Bruising. Follow these instructions at home: Puncture site care  Follow instructions from your health care provider about how to take care of the puncture site. Make sure you: Wash your hands with soap and water before and after you change your bandage (dressing). If soap and water are not available, use hand sanitizer. Change your dressing as told by your health care provider. Check your puncture site every day for signs of infection. Check for: More redness, swelling, or pain. Fluid or blood. Warmth. Pus or a bad smell. Activity Return to your normal activities as told by your health care provider. Ask your health care provider what activities are safe for you. Do not lift anything that is heavier than 10 lb (4.5 kg), or the limit that you are told, until your health care provider says that it is safe. Do not drive for 24 hours if you were given a sedative during your procedure. General instructions  Take over-the-counter and prescription medicines only as told by your health care provider. Do not take baths, swim, or use a hot tub until your health care provider approves. Ask your health care provider if you may take showers. You may only be allowed to take sponge baths. If directed, put ice on the affected area. To do this: Put ice in a plastic bag. Place a towel between your skin and the bag. Leave the ice on  for 20 minutes, 2-3 times a day. Keep all follow-up visits as told by your health care provider. This is important. Contact a health care provider if: Your pain is not controlled with medicine. You have a fever. You have more redness, swelling, or pain around the puncture site. You have fluid or blood coming from the puncture site. Your puncture site feels warm to the touch. You have pus or a bad smell coming from the puncture site. Summary After the procedure, it is common to have mild pain, tenderness, swelling, and bruising. Follow instructions from your health care provider about how to take care of the puncture site and what activities are safe for you. Take over-the-counter and prescription medicines only as told by your health care provider. Contact a health care provider if you have any signs of infection, such as fluid or blood coming from the puncture site. This information is not intended to replace advice given to you by your health care provider. Make sure you discuss any questions you have with your health care provider. Document Revised: 10/13/2018 Document Reviewed: 10/13/2018 Elsevier Patient Education  2023 Else[employer]puncture site. Summary After the procedure, it is common to have mild pain, tenderness, swelling, and bruising. Follow instructions from your health care provider about how to take care of the puncture site and what activities are safe for you. Take over-the-counter and prescription medicines only as told by your health care provider. Contact a health care provider if you have any signs of infection, such as fluid or blood coming from the puncture site. This  information is not intended to replace advice given to you by your health care provider. Make sure you discuss any questions you have with your health care provider. Document Revised: 10/13/2018 Document Reviewed: 10/13/2018 Elsevier Patient Education  [place].    Moderate Conscious  Sedation, Adult, Care After This sheet gives you information about how to care for yourself after your procedure. Your health care provider may also give you more specific instructions. If you have problems or questions, contact your health care provider. What can I expect after the procedure? After the procedure, it is common to have: Sleepiness for several hours. Impaired judgment for several hours. Difficulty with balance. Vomiting if you eat too soon. Follow these instructions at home: For the time period you were told by your health care provider: Rest. Do not participate in activities where you could fall or become injured. Do not drive or use machinery. Do not drink alcohol. Do not take sleeping pills or medicines that cause drowsiness. Do not make important decisions or sign legal documents. Do not take care of children on your own. Eating and drinking  Follow the diet recommended by your health care provider. Drink enough fluid to keep your urine pale yellow. If you vomit: Drink water, juice, or soup when you can drink without vomiting. Make sure you have little or no nausea before eating solid foods. General instructions Take over-the-counter and prescription medicines only as told by your health care provider. Have a responsible adult stay with you for the time you are told. It is important to have someone help care for you until you are awake and alert. Do not smoke. Keep all follow-up visits as told by your health care provider. This is important. Contact a health care provider if: You are still sleepy or having trouble with balance after 24 hours. You feel light-headed. You keep feeling nauseous or you keep vomiting. You develop a rash. You have a fever. You have redness or swelling around the IV site. Get help right away if: You have trouble breathing. You have new-onset confusion at home. Summary After the procedure, it is common to feel sleepy, have impaired  judgment, or feel nauseous if you eat too soon. Rest after you get home. Know the things you should not do after the procedure. Follow the diet recommended by your health care provider and drink enough fluid to keep your urine pale yellow. Get help right away if you have trouble breathing or new-onset confusion at home. This information is not intended to replace advice given to you by your health care provider. Make sure you discuss any questions you have with your health care provider. Document Revised: 09/24/2019 Document Reviewed: 04/22/2019 Elsevier Patient Education  [place].

## 2022-04-29 NOTE — Procedures (Signed)
Vascular and Interventional Radiology Procedure Note  Patient: [name] DOB: August 27, 1960 Medical Record Number: [id number] Note Date/Time: 04/29/22 10:28 AM   Performing Physician: Michaelle Birks, MD Assistant(s): None  Diagnosis: Leukopenia and Anemia   Procedure: BONE MARROW ASPIRATION and BIOPSY  Anesthesia: Conscious Sedation Complications: None Estimated Blood Loss: Minimal Specimens: Sent for Pathology  Findings:  Successful CT-guided bone marrow aspiration and biopsy A total of 1 cores were obtained. Hemostasis of the tract was achieved using Manual Pressure.  Plan: Bed rest for 1 hours.  See detailed procedure note with images in PACS. The patient tolerated the procedure well without incident or complication and was returned to Recovery in stable condition.    Michaelle Birks, MD [facility]   Pager. [place]

## 2022-04-30 ENCOUNTER — Inpatient Hospital Stay: Payer: POS | Admitting: Internal Medicine

## 2022-04-30 ENCOUNTER — Inpatient Hospital Stay: Payer: POS

## 2022-05-01 LAB — SURGICAL PATHOLOGY

## 2022-05-07 ENCOUNTER — Ambulatory Visit: Payer: POS | Admitting: Internal Medicine

## 2022-05-07 ENCOUNTER — Other Ambulatory Visit: Payer: POS

## 2022-05-07 ENCOUNTER — Encounter (HOSPITAL_COMMUNITY): Payer: Self-pay | Admitting: Internal Medicine

## 2022-05-11 NOTE — Progress Notes (Unsigned)
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  The patient had similar problems that has been going on for several years.   PRIOR THERAPY: None  CURRENT THERAPY: Observation  INTERVAL HISTORY: [name] 61 y.o. female returns to clinic today for follow-up visit.  The patient was last seen on 03/28/2022 Medicare Dr. Julien Nordmann.  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  This had been going on for several years.  She recently had a bone marrow biopsy and aspirate to rule rule out any bone marrow pathology.  Since last being seen, the patient denies any changes in her health.  Denies any fever, chills, night sweats, unexplained weight loss, or lymphadenopathy.  Fatigue?  Pallor?  She denies any abnormal bleeding or bruising.  She denies any frequent infections.  She denies any abdominal bloating, early satiety, or abdominal pain.  She is here today for evaluation, repeat blood work, and to review her bone marrow biopsy results.      MEDICAL HISTORY: Past Medical History:  Diagnosis Date   Breakthrough bleeding 2001   Fibroids, submucosal    GERD (gastroesophageal reflux disease)    H/O carpal tunnel syndrome    H/O fatigue    H/O menorrhagia    Hx of thyroid nodule    Hx: UTI (urinary tract infection)    Irritable bowel disease    Mastalgia 2010   Postcoital bleeding 2012   Urinary incontinence, mixed 2009   Vaginal atrophy    Varicose veins    Yeast infection     ALLERGIES:  is allergic to nitrofurantoin macrocrystal, octacosanol, and codeine.  MEDICATIONS:  Current Outpatient Medications  Medication Sig Dispense Refill   Ascorbic Acid (VITAMIN C) 100 MG tablet Take 100 mg by mouth daily.     Biotin 10 MG TABS Take by mouth.     cetirizine (ZYRTEC) 10 MG tablet Take 10 mg by  mouth daily.     Cranberry 500 MG CAPS Take by mouth.     Docosahexaenoic Acid (DHA OMEGA 3 PO) Take by mouth.     esomeprazole (NEXIUM) 40 MG capsule Take 40 mg by mouth as needed.     estradiol (ESTRACE VAGINAL) 0.1 MG/GM vaginal cream See admin instructions.     ferrous sulfate 325 (65 FE) MG tablet Take 325 mg by mouth daily with breakfast.     Multiple Vitamin (MULTIVITAMIN) tablet Take 1 tablet by mouth daily.     Multiple Vitamins-Minerals (AIRBORNE) CHEW See admin instructions.     Probiotic Product (PROBIOTIC DAILY PO) Take by mouth.     No current facility-administered medications for this visit.    SURGICAL HISTORY:  Past Surgical History:  Procedure Laterality Date    S/P bilateral tubal ligation  1998   carpal tunnel repair  right   DILATION AND CURETTAGE OF UTERUS  07/14/2009   HAMMER TOE SURGERY  1996   HYSTEROSCOPY  07/14/2009   INTRAUTERINE DEVICE INSERTION     NOVASURE ABLATION  07/14/2009   THYROIDECTOMY, PARTIAL     TUBAL LIGATION  1998   bilateral    REVIEW OF SYSTEMS:   Review of Systems  Constitutional: Negative for appetite change, chills, fatigue, fever and unexpected weight change.  HENT:   Negative for mouth sores, nosebleeds, sore throat and trouble swallowing.   Eyes: Negative for  eye problems and icterus.  Respiratory: Negative for cough, hemoptysis, shortness of breath and wheezing.   Cardiovascular: Negative for chest pain and leg swelling.  Gastrointestinal: Negative for abdominal pain, constipation, diarrhea, nausea and vomiting.  Genitourinary: Negative for bladder incontinence, difficulty urinating, dysuria, frequency and hematuria.   Musculoskeletal: Negative for back pain, gait problem, neck pain and neck stiffness.  Skin: Negative for itching and rash.  Neurological: Negative for dizziness, extremity weakness, gait problem, headaches, light-headedness and seizures.  Hematological: Negative for adenopathy. Does not bruise/bleed easily.   Psychiatric/Behavioral: Negative for confusion, depression and sleep disturbance. The patient is not nervous/anxious.     PHYSICAL EXAMINATION:  There were no vitals taken for this visit.  ECOG PERFORMANCE STATUS: {CHL ONC ECOG [id number]  Physical Exam  Constitutional: Oriented to person, place, and time and well-developed, well-nourished, and in no distress. No distress.  HENT:  Head: Normocephalic and atraumatic.  Mouth/Throat: Oropharynx is clear and moist. No oropharyngeal exudate.  Eyes: Conjunctivae are normal. Right eye exhibits no discharge. Left eye exhibits no discharge. No scleral icterus.  Neck: Normal range of motion. Neck supple.  Cardiovascular: Normal rate, regular rhythm, normal heart sounds and intact distal pulses.   Pulmonary/Chest: Effort normal and breath sounds normal. No respiratory distress. No wheezes. No rales.  Abdominal: Soft. Bowel sounds are normal. Exhibits no distension and no mass. There is no tenderness.  Musculoskeletal: Normal range of motion. Exhibits no edema.  Lymphadenopathy:    No cervical adenopathy.  Neurological: Alert and oriented to person, place, and time. Exhibits normal muscle tone. Gait normal. Coordination normal.  Skin: Skin is warm and dry. No rash noted. Not diaphoretic. No erythema. No pallor.  Psychiatric: Mood, memory and judgment normal.  Vitals reviewed.  LABORATORY DATA: Lab Results  Component Value Date   WBC 3.5 (L) 04/29/2022   HGB 11.1 (L) 04/29/2022   HCT 35.9 (L) 04/29/2022   MCV 88.6 04/29/2022   PLT 192 04/29/2022      Chemistry      Component Value Date/Time   NA 140 03/28/2022 1023   K 3.8 03/28/2022 1023   CL 102 03/28/2022 1023   CO2 32 03/28/2022 1023   BUN 11 03/28/2022 1023   CREATININE 0.89 03/28/2022 1023      Component Value Date/Time   CALCIUM 9.7 03/28/2022 1023   ALKPHOS 76 03/28/2022 1023   AST 23 03/28/2022 1023   ALT 16 03/28/2022 1023   BILITOT 0.9 03/28/2022 1023        RADIOGRAPHIC STUDIES:  CT BONE MARROW BIOPSY & ASPIRATION  Result Date: 04/29/2022 INDICATION: Anemia.  Leukopenia. EXAM: CT GUIDED BONE MARROW ASPIRATION AND CORE BIOPSY MEDICATIONS: None. ANESTHESIA/SEDATION: Moderate (conscious) sedation was employed during this procedure. A total of Versed 3 mg and Fentanyl 100 mcg was administered intravenously. Moderate Sedation Time: 15 minutes. The patient's level of consciousness and vital signs were monitored continuously by radiology nursing throughout the procedure under my direct supervision. FLUOROSCOPY TIME:  CT dose in mGy was not provided. COMPLICATIONS: None immediate. Estimated blood loss: <5 mL PROCEDURE: RADIATION DOSE REDUCTION: This exam was performed according to the departmental dose-optimization program which includes automated exposure control, adjustment of the mA and/or kV according to patient size and/or use of iterative reconstruction technique. Informed written consent was obtained from the patient after a thorough discussion of the procedural risks, benefits and alternatives. All questions were addressed. Maximal Sterile Barrier Technique was utilized including caps, mask, sterile gowns, sterile gloves, sterile drape, hand hygiene  and skin antiseptic. A timeout was performed prior to the initiation of the procedure. The patient was positioned prone and non-contrast localization CT was performed of the pelvis to demonstrate the iliac marrow spaces. Maximal barrier sterile technique utilized including caps, mask, sterile gowns, sterile gloves, large sterile drape, hand hygiene, and chlorhexidine prep. Under sterile conditions and local anesthesia, an 11 gauge coaxial bone biopsy needle was advanced into the RIGHT iliac marrow space. Needle position was confirmed with CT imaging. Initially, bone marrow aspiration was performed. Next, the 11 gauge outer cannula was utilized to obtain a 1 iliac bone marrow core biopsy. Needle was removed.  Hemostasis was obtained with compression. The patient tolerated the procedure well. Samples were prepared with the cytotechnologist. IMPRESSION: Successful CT-guided bone marrow aspiration and biopsy, as above. Michaelle Birks, MD [facility] Radiology Electronically Signed   By: Michaelle Birks M.D.   On: 04/29/2022 11:33   VAS [place] LOWER EXTREMITY VENOUS REFLUX  Result Date: 04/19/2022  Lower Venous Reflux Study Patient Name:  [name]  Date of Exam:   04/19/2022 Medical Rec #: [id number]           Accession #:    [id number] Date of Birth: 08-30-1960          Patient Gender: F Patient Age:   10 years Exam Location:  [facility] Procedure:      VAS [place] LOWER EXTREMITY VENOUS REFLUX Referring Phys: Aldona Bar RHYNE --------------------------------------------------------------------------------  Indications: Spider veins and tightness around the calf and behind knee.  Performing Technologist: Ronal Fear RVS, RCS  Examination Guidelines: A complete evaluation includes B-mode imaging, spectral Doppler, color Doppler, and power Doppler as needed of all accessible portions of each vessel. Bilateral testing is considered an integral part of a complete examination. Limited examinations for reoccurring indications may be performed as noted. The reflux portion of the exam is performed with the patient in reverse Trendelenburg. Significant venous reflux is defined as >500 ms in the superficial venous system, and >1 second in the deep venous system.  Venous Reflux Times +------------------+---------+------+-----------+------------+--------+ RIGHT             Reflux NoRefluxReflux TimeDiameter cmsComments                             Yes                                  +------------------+---------+------+-----------+------------+--------+ CFV               no                                              +------------------+---------+------+-----------+------------+--------+ FV mid            no                                             +------------------+---------+------+-----------+------------+--------+ Popliteal         no                                             +------------------+---------+------+-----------+------------+--------+  GSV at [facility]        no                            0.50             +------------------+---------+------+-----------+------------+--------+ GSV prox thigh    no                            0.23             +------------------+---------+------+-----------+------------+--------+ GSV mid thigh     no                            0.28             +------------------+---------+------+-----------+------------+--------+ GSV dist thigh    no                            0.15             +------------------+---------+------+-----------+------------+--------+ SSV Pop Fossa     no                            0.20             +------------------+---------+------+-----------+------------+--------+ anterior accessory          yes    >500 ms      0.37             +------------------+---------+------+-----------+------------+--------+   Summary: Right: - No evidence of deep vein thrombosis from the common femoral through the popliteal veins. - No evidence of superficial venous thrombosis. - The deep venous system is competent. - The great and small saphenous veins are competent. - The anterior accessory branch is not competent.  *See table(s) above for measurements and observations. Electronically signed by Orlie Pollen on 04/19/2022 at 4:46:11 PM.    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 The patient had extensive blood work few months ago that was unremarkable for any abnormality.  She recently had a bone marrow biopsy and  aspirate performed to rule out any bone marrow pathology.  The patient was seen with Dr. Julien Nordmann.  Labs were reviewed and her bone marrow biopsy results were reviewed.  No clear etiology to explain her blood work.  Her bone marrow biopsy and aspirate was unremarkable.  Dr. Julien Nordmann recommends the patient continue on observation with repeat labs in 6 months.  The patient was advised to call immediately if she has any concerning symptoms in the interval. The patient voices understanding of current disease status and treatment options and is in agreement with the current care plan. All questions were answered. The patient knows to call the clinic with any problems, questions or concerns. We can certainly see the patient much sooner if necessary        No orders of the defined types were placed in this encounter.    I spent {CHL ONC TIME VISIT - AFBXU:[id number]} counseling the patient face to face. The total time spent in the appointment was {CHL ONC TIME VISIT - BTYOM:[id number]}.  Metha Kolasa L Cameryn Schum, PA-C 05/11/22

## 2022-05-13 ENCOUNTER — Inpatient Hospital Stay (HOSPITAL_BASED_OUTPATIENT_CLINIC_OR_DEPARTMENT_OTHER): Payer: POS | Admitting: Physician Assistant

## 2022-05-13 ENCOUNTER — Telehealth: Payer: Self-pay

## 2022-05-13 ENCOUNTER — Inpatient Hospital Stay: Payer: POS | Attending: Internal Medicine

## 2022-05-13 ENCOUNTER — Other Ambulatory Visit: Payer: Self-pay

## 2022-05-13 VITALS — BP 112/78 | HR 65 | Temp 98.4°F | Resp 16 | Wt 137.5 lb

## 2022-05-13 DIAGNOSIS — Z885 Allergy status to narcotic agent status: Secondary | ICD-10-CM | POA: Diagnosis not present

## 2022-05-13 DIAGNOSIS — D72819 Decreased white blood cell count, unspecified: Secondary | ICD-10-CM | POA: Insufficient documentation

## 2022-05-13 DIAGNOSIS — D649 Anemia, unspecified: Secondary | ICD-10-CM | POA: Insufficient documentation

## 2022-05-13 DIAGNOSIS — R300 Dysuria: Secondary | ICD-10-CM | POA: Insufficient documentation

## 2022-05-13 DIAGNOSIS — Z87440 Personal history of urinary (tract) infections: Secondary | ICD-10-CM | POA: Insufficient documentation

## 2022-05-13 DIAGNOSIS — Z79899 Other long term (current) drug therapy: Secondary | ICD-10-CM | POA: Insufficient documentation

## 2022-05-13 DIAGNOSIS — D7589 Other specified diseases of blood and blood-forming organs: Secondary | ICD-10-CM

## 2022-05-13 LAB — CBC WITH DIFFERENTIAL (CANCER CENTER ONLY)
Abs Immature Granulocytes: 0.01 10*3/uL (ref 0.00–0.07)
Basophils Absolute: 0 10*3/uL (ref 0.0–0.1)
Basophils Relative: 1 %
Eosinophils Absolute: 0 10*3/uL (ref 0.0–0.5)
Eosinophils Relative: 1 %
HCT: 36.1 % (ref 36.0–46.0)
Hemoglobin: 11.6 g/dL — ABNORMAL LOW (ref 12.0–15.0)
Immature Granulocytes: 0 %
Lymphocytes Relative: 35 %
Lymphs Abs: 1.2 10*3/uL (ref 0.7–4.0)
MCH: 27.8 pg (ref 26.0–34.0)
MCHC: 32.1 g/dL (ref 30.0–36.0)
MCV: 86.4 fL (ref 80.0–100.0)
Monocytes Absolute: 0.4 10*3/uL (ref 0.1–1.0)
Monocytes Relative: 12 %
Neutro Abs: 1.7 10*3/uL (ref 1.7–7.7)
Neutrophils Relative %: 51 %
Platelet Count: 207 10*3/uL (ref 150–400)
RBC: 4.18 MIL/uL (ref 3.87–5.11)
RDW: 13.8 % (ref 11.5–15.5)
WBC Count: 3.3 10*3/uL — ABNORMAL LOW (ref 4.0–10.5)
nRBC: 0 % (ref 0.0–0.2)

## 2022-05-13 LAB — URINALYSIS, COMPLETE (UACMP) WITH MICROSCOPIC
Bilirubin Urine: NEGATIVE
Glucose, UA: NEGATIVE mg/dL
Hgb urine dipstick: NEGATIVE
Ketones, ur: NEGATIVE mg/dL
Leukocytes,Ua: NEGATIVE
Nitrite: NEGATIVE
Protein, ur: NEGATIVE mg/dL
Specific Gravity, Urine: 1.006 (ref 1.005–1.030)
pH: 6 (ref 5.0–8.0)

## 2022-05-13 NOTE — Telephone Encounter (Signed)
This nurse reached out to patient and made her aware that the results of her urinalysis was negative and there is no need for the culture.  Patient acknowledged understanding.  No further questions or concerns at this time.

## 2022-07-30 ENCOUNTER — Ambulatory Visit (INDEPENDENT_AMBULATORY_CARE_PROVIDER_SITE_OTHER): Payer: POS

## 2022-07-30 DIAGNOSIS — I83893 Varicose veins of bilateral lower extremities with other complications: Secondary | ICD-10-CM

## 2022-07-30 NOTE — Progress Notes (Signed)
Treated pt's BLE spider veins and small reticular veins with Asclera 1% administered with a 27g butterfly.  Patient received a total of 4 mL. Pt tolerated well. Pt only wanted to treat select areas, specifically around both knees and calf. Pt was given post treatment care instructions on handout and verbally. She will call with any questions/concerns, should they arise.     Photos: No.  Compression stockings applied: Yes.

## 2022-08-15 IMAGING — DX DG TOE GREAT 2+V*L*
3 series · 3 of 3 positions shown · non-contrast
Comparison: No prior.

CLINICAL DATA: Great toe swelling and bruising.

EXAM:
LEFT GREAT TOE

[toe ap]
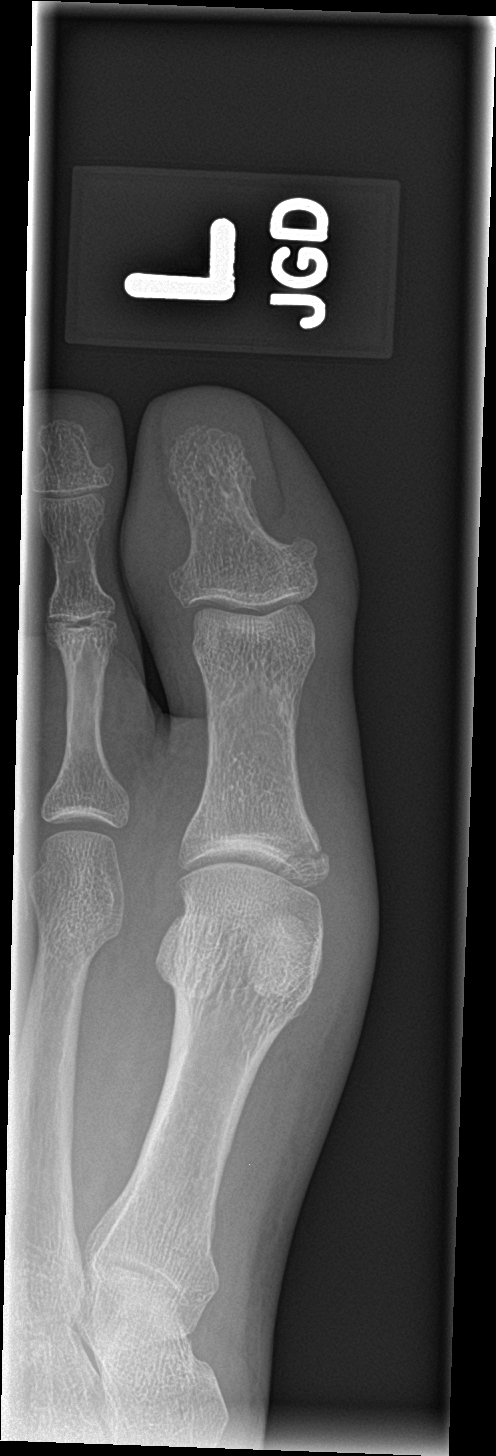

[toe obl]
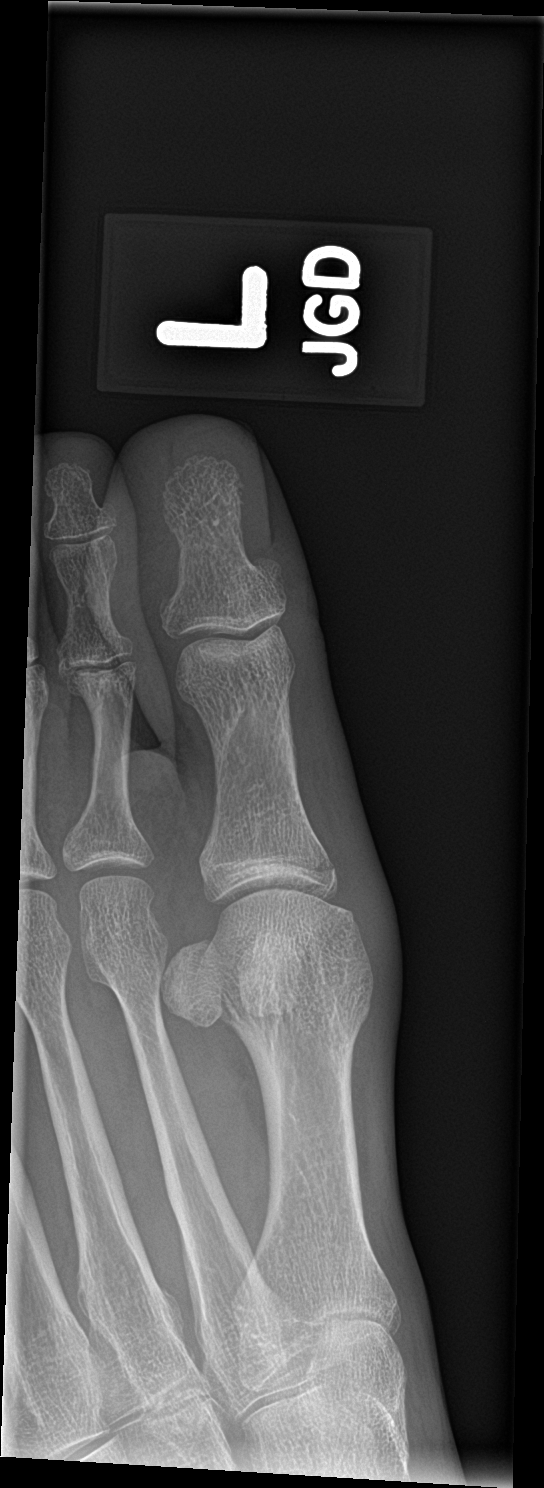

[toe lat]
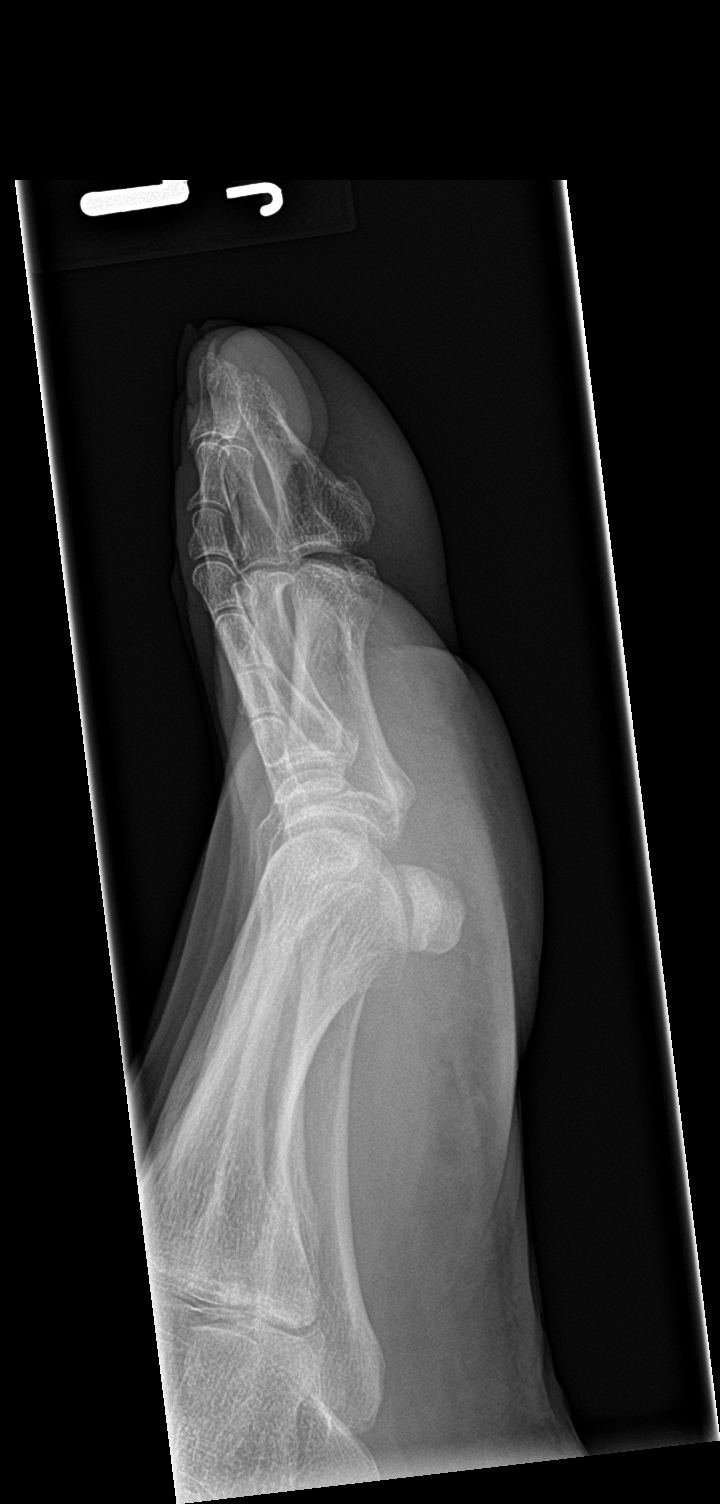

[3 of 3 positions shown; findings below may reference images not displayed]

FINDINGS: Minimally displaced fracture of the base of the proximal phalanx of
the left great toe noted. The fracture extends into the metatarsal
phalangeal joint space. No radiopaque foreign body.
IMPRESSION: Minimally displaced fracture of the base of the proximal phalanx of
left great toe noted. The fracture extends into the metatarsal
phalangeal joint space.

## 2022-09-18 ENCOUNTER — Other Ambulatory Visit: Payer: Self-pay | Admitting: Obstetrics and Gynecology

## 2022-09-18 DIAGNOSIS — N6310 Unspecified lump in the right breast, unspecified quadrant: Secondary | ICD-10-CM

## 2022-10-08 DIAGNOSIS — I8393 Asymptomatic varicose veins of bilateral lower extremities: Secondary | ICD-10-CM

## 2022-10-31 ENCOUNTER — Other Ambulatory Visit: Payer: Self-pay | Admitting: Obstetrics and Gynecology

## 2022-10-31 ENCOUNTER — Ambulatory Visit
Admission: RE | Admit: 2022-10-31 | Discharge: 2022-10-31 | Disposition: A | Discharge status: Home / Self Care | Payer: POS | Source: Ambulatory Visit | Attending: Obstetrics and Gynecology | Admitting: Obstetrics and Gynecology

## 2022-10-31 DIAGNOSIS — N6310 Unspecified lump in the right breast, unspecified quadrant: Secondary | ICD-10-CM

## 2023-01-07 ENCOUNTER — Other Ambulatory Visit: Payer: Self-pay

## 2023-01-07 ENCOUNTER — Encounter: Payer: Self-pay | Admitting: *Deleted

## 2023-01-07 ENCOUNTER — Ambulatory Visit
Admission: EM | Admit: 2023-01-07 | Discharge: 2023-01-07 | Disposition: A | Discharge status: Home / Self Care | Payer: Commercial Managed Care - PPO | Attending: Emergency Medicine | Admitting: Emergency Medicine

## 2023-01-07 DIAGNOSIS — U071 COVID-19: Secondary | ICD-10-CM | POA: Diagnosis present

## 2023-01-07 DIAGNOSIS — B9789 Other viral agents as the cause of diseases classified elsewhere: Secondary | ICD-10-CM

## 2023-01-07 DIAGNOSIS — J019 Acute sinusitis, unspecified: Secondary | ICD-10-CM | POA: Diagnosis not present

## 2023-01-07 NOTE — Discharge Instructions (Addendum)
Continue to take guaifenesin/dextromethorphan. These are the medicines in Robitussin DM and they are also in Mucinex DM. I would switch to Mucinex DM which is a pill form that you only have to take twice a day- ok to use the generic brands.   Try using saline irrigation, such as with a neti pot, several times a day while you are sick. Many neti pots come with salt packets premeasured to use to make saline. If you use your own salt, make sure it is kosher salt or sea salt (don't use table salt as it has iodine in it and you don't need that in your nose). Use distilled water to make saline. If you mix your own saline using your own salt, the recipe is 1/4 teaspoon salt in 1 cup warm water. Using saline irrigation can help prevent and treat sinus infections.  If you feel you are not getting better and are sick for more than 10 days or if you develop a high fever, try [facility] video visits. Antibiotics are not recommended by the Infectious Disease Society of [place] unless you have severe symptoms (including high fever) or you have symptoms for more than 10 days. If you still have symptoms after 10 days, antibiotics should be considered.   You will get a call if test is positive, you will not get a call if test is negative but you can check results in MyChart if you have a MyChart account.

## 2023-01-07 NOTE — ED Provider Notes (Signed)
[facility]    CSN: [id number] Arrival date & time: 01/07/23  1312      History   Chief Complaint Chief Complaint  Patient presents with   Facial Pain    HPI [name] is a 62 y.o. female.Pt was out of town last week for [employer] in [place]. STarting feeling ill 01/05/23, the last day. Has mild sore throat, lots of nasal congesion and post-nasal drainage, occasional cough. Some chills, no fever. Drainage is clear mucus. Took Robitussin DM and it helped some. Has not tested self for covid at home.   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   Varicose veins of lower extremities with other complications 10/04/2013   Fibroids 09/23/2011   S/P endometrial ablation 09/23/2011   IUD 09/23/2011   H/O bladder repair surgery 09/23/2011    Past Surgical History:  Procedure Laterality Date    S/P bilateral tubal ligation  1998   carpal tunnel repair  right   DILATION AND CURETTAGE OF UTERUS  07/14/2009   HAMMER TOE SURGERY  1996   HYSTEROSCOPY  07/14/2009   INTRAUTERINE DEVICE INSERTION     NOVASURE ABLATION  07/14/2009   THYROIDECTOMY, PARTIAL     TUBAL LIGATION  1998   bilateral    OB History     Gravida  3   Para  2   Term      Preterm      AB  1   Living  2      SAB      IAB      Ectopic      Multiple      Live Births               Home Medications    Prior to Admission medications   Medication Sig Start Date End Date Taking?  Authorizing Provider  Ascorbic Acid (VITAMIN C) 100 MG tablet Take 100 mg by mouth daily.   Yes [provider]  cetirizine (ZYRTEC) 10 MG tablet Take 10 mg by mouth daily.   Yes [provider]  Cranberry 500 MG CAPS Take by mouth.   Yes [provider]  Docosahexaenoic Acid (DHA OMEGA 3 PO) Take by mouth.   Yes [provider]  estradiol (ESTRACE VAGINAL) 0.1 MG/GM vaginal cream See admin instructions. 10/09/16  Yes [provider]  ferrous sulfate 325 (65 FE) MG tablet Take 325 mg by mouth daily with breakfast.   Yes [provider]  Multiple Vitamin (MULTIVITAMIN) tablet Take 1 tablet by mouth daily.   Yes [provider]  Multiple Vitamins-Minerals (AIRBORNE) CHEW See admin instructions.   Yes [provider]  Probiotic Product (PROBIOTIC DAILY PO) Take by mouth.   Yes [provider]  Biotin 10 MG TABS Take by mouth.    [provider]  esomeprazole (NEXIUM) 40 MG  capsule Take 40 mg by mouth as needed.    [provider]    Family History Family History  Problem Relation Age of Onset   Hypertension Mother    Hypertension Sister    Hypertension Brother    Hypertension Sister    Hypertension Brother     Social History Social History   Tobacco Use   Smoking status: Never   Smokeless tobacco: Never  Substance Use Topics   Alcohol use: No   Drug use: No     Allergies   Nitrofurantoin macrocrystal, Octacosanol, and Codeine   Review of Systems Review of Systems   Physical Exam Triage Vital Signs ED Triage Vitals  Encounter Vitals Group     BP 01/07/23 1339 113/71     Systolic BP Percentile --      Diastolic BP Percentile --      Pulse Rate 01/07/23 1339 84     Resp 01/07/23 1339 16     Temp 01/07/23 1339 99.2 F (37.3 C)     Temp src --      SpO2 01/07/23 1339 94 %     Weight --      Height --      Head Circumference --      Peak Flow --      Pain Score 01/07/23  1336 0     Pain Loc --      Pain Education --      Exclude from Growth Chart --    No data found.  Updated Vital Signs BP 113/71   Pulse 84   Temp 99.2 F (37.3 C)   Resp 16   SpO2 94%   Visual Acuity Right Eye Distance:   Left Eye Distance:   Bilateral Distance:    Right Eye Near:   Left Eye Near:    Bilateral Near:     Physical Exam Constitutional:      General: She is not in acute distress.    Appearance: Normal appearance. She is ill-appearing. She is not toxic-appearing.  HENT:     Right Ear: Tympanic membrane, ear canal and external ear normal.     Left Ear: Tympanic membrane, ear canal and external ear normal.     Nose: Congestion present.     Mouth/Throat:     Mouth: Mucous membranes are moist.     Pharynx: Oropharynx is clear. No oropharyngeal exudate or posterior oropharyngeal erythema.  Cardiovascular:     Rate and Rhythm: Normal rate and regular rhythm.  Pulmonary:     Effort: Pulmonary effort is normal.     Breath sounds: Normal breath sounds.     Comments: Occasional dry cough Lymphadenopathy:     Head:     Right side of head: Submandibular adenopathy present.     Left side of head: Submandibular adenopathy present.     Comments: Mild submandibular lymphadenopathy  Neurological:     Mental Status: She is alert.      UC Treatments / Results  Labs (all labs ordered are listed, but only abnormal results are displayed) Labs Reviewed  SARS CORONAVIRUS 2 (TAT 6-24 HRS)    EKG   Radiology No results found.  Procedures Procedures (including critical care time)  Medications Ordered in UC Medications - No data to display  Initial Impression / Assessment and Plan / UC Course  I have reviewed the triage vital signs and the nursing notes.  Pertinent labs & imaging results that were available during my care  of the patient were reviewed by me and considered in my medical decision making (see chart for details).     Explained unlikely  bacterial infection. Covid test results pending. Recommended supportive care. Explained reasons for seeking f/u.   Final Clinical Impressions(s) / UC Diagnoses   Final diagnoses:  Acute viral sinusitis     Discharge Instructions      Continue to take guaifenesin/dextromethorphan. These are the medicines in Robitussin DM and they are also in Mucinex DM. I would switch to Mucinex DM which is a pill form that you only have to take twice a day- ok to use the generic brands.   Try using saline irrigation, such as with a neti pot, several times a day while you are sick. Many neti pots come with salt packets premeasured to use to make saline. If you use your own salt, make sure it is kosher salt or sea salt (don't use table salt as it has iodine in it and you don't need that in your nose). Use distilled water to make saline. If you mix your own saline using your own salt, the recipe is 1/4 teaspoon salt in 1 cup warm water. Using saline irrigation can help prevent and treat sinus infections.  If you feel you are not getting better and are sick for more than 10 days or if you develop a high fever, try [facility] video visits. Antibiotics are not recommended by the Infectious Disease Society of [place] unless you have severe symptoms (including high fever) or you have symptoms for more than 10 days. If you still have symptoms after 10 days, antibiotics should be considered.   You will get a call if test is positive, you will not get a call if test is negative but you can check results in MyChart if you have a MyChart account.      ED Prescriptions   None    PDMP not reviewed this encounter.   Cathlyn Parsons, NP 01/07/23 [phone]

## 2023-01-07 NOTE — ED Triage Notes (Signed)
Pt reports since Sunday she has had sinus drainage ,cold chills,sore glands.

## 2023-02-18 ENCOUNTER — Encounter: Payer: Self-pay | Admitting: Obstetrics and Gynecology

## 2023-02-18 DIAGNOSIS — N951 Menopausal and female climacteric states: Secondary | ICD-10-CM

## 2023-02-20 ENCOUNTER — Other Ambulatory Visit: Payer: Self-pay | Admitting: Obstetrics and Gynecology

## 2023-02-20 DIAGNOSIS — N951 Menopausal and female climacteric states: Secondary | ICD-10-CM

## 2023-03-31 NOTE — Progress Notes (Unsigned)
New Patient Evaluation and Consultation  Referring Provider: Deatra James, MD PCP: Deatra James, MD Date of Service: 04/01/2023  SUBJECTIVE Chief Complaint: No chief complaint on file.  History of Present Illness: [name] is a 62 y.o. Black or African-American female seen in consultation at the request of self for evaluation of recurrent UTI.    Prior management by Dr. Arita Miss at [facility] urology and Dr. Beverely Pace at [facility] in 2018 and Rx keflex for postcoital prophylaxis. Recommended cystoscopy and UDS in 2018, postponed due to improvement symptoms***. Per chart review: "contracted and tender vaginal mucosa in the location where mesh would be expected" Renal Ultrasound 10/25/16 with "Mildly increased right cortical echogenicity suggestive of renal medical disease". Take probiotics, vaginal estrogen, cranberry extract and drinks of water ***relief Tried pelvic floor exercises History of TVT and anterior/posterior colporrhaphy in 2015 by ***  ***Review of records significant for: ***Mild leukocytopenia and anemia of unknown etiology managed by Dr.Mohamed History of thyroid cancer  Urinary Symptoms: Leaks urine with with a full bladder, with movement to the bathroom, with urgency, and trying to make it to the bathroom Leaks *** time(s) per {days/wks/mos/yrs:310907}.  Denies pad use Patient is not bothered by UI symptoms.  Day time voids >10.  Nocturia: 1 times per night to void. Voiding dysfunction:  does not empty bladder well.  Patient does not use a catheter to empty bladder.  When urinating, patient feels a weak stream and to push on her belly or vagina to empty bladder Drinks: ***oz per day  UTIs:  3-4  UTI's in the last year.   {ACTIONS;DENIES/REPORTS:21021675::"Denies"} history of {urologic concerns:24757} No results found for the last 90 days.   Pelvic Organ Prolapse Symptoms:                  Patient Denies a feeling of a bulge the vaginal area.   Bowel  Symptom: Bowel movements: 1 time(s) per day Stool consistency: soft  Straining: no.  Splinting: no.  Incomplete evacuation: yes.  Patient Admits to accidental bowel leakage / fecal incontinence if she lifts after bowel movement  Occurs: *** time(s) per {Time; day/week/month:13537}  Consistency with leakage: liquid Bowel regimen: diet Last colonoscopy: Date 2016***, Results *** [facility] Colon[facility]          Overdue - Colonoscopy (Every 10 Years) Overdue since 07/16/2014    07/16/2004  [facility] COLONOSCOPY   Only the first 1 history entries have been loaded, but more history exists.            Sexual Function Sexually active: no.  Sexual orientation: {Sexual Orientation:[phone]} Pain with sex: No  Pelvic Pain Denies pelvic pain  Past Medical History:  Past Medical History:  Diagnosis Date   Breakthrough bleeding 2001   Fibroids, submucosal    GERD (gastroesophageal reflux disease)    H/O carpal tunnel syndrome    H/O fatigue    H/O menorrhagia    Hx of thyroid nodule    Hx: UTI (urinary tract infection)    Irritable bowel disease    Mastalgia 2010   Postcoital bleeding 2012   Urinary incontinence, mixed 2009   Vaginal atrophy    Varicose veins    Yeast infection      Past Surgical History:   Past Surgical History:  Procedure Laterality Date    S/P bilateral tubal ligation  1998   carpal tunnel repair  right   DILATION AND CURETTAGE OF UTERUS  07/14/2009   HAMMER TOE SURGERY  1996  HYSTEROSCOPY  07/14/2009   INTRAUTERINE DEVICE INSERTION     NOVASURE ABLATION  07/14/2009   THYROIDECTOMY, PARTIAL     TUBAL LIGATION  1998   bilateral     Past OB/GYN History: OB History  Gravida Para Term Preterm AB Living  3 2     1 2   SAB IAB Ectopic Multiple Live Births               # Outcome Date GA Lbr Len/2nd Weight Sex Type Anes PTL Lv  3 AB           2 Para           1 Para             Vaginal deliveries: ***,  Forceps/ Vacuum deliveries: ***, Cesarean section:  *** Menopausal: No ***s/p endometrial ablation and IUD placement for AUB-L Contraception: n/a***. Last pap smear was ***01/2023.  Any history of abnormal pap smears: no. No results found for: "DIAGPAP", "HPVHIGH", "ADEQPAP"  Medications: Patient has a current medication list which includes the following prescription(s): vitamin c, biotin, cetirizine, cranberry, docosahexaenoic acid, esomeprazole, estradiol, ferrous sulfate, multivitamin, airborne, and probiotic product.   Allergies: Patient is allergic to nitrofurantoin macrocrystal, octacosanol, and codeine.   Social History:  Social History   Tobacco Use   Smoking status: Never   Smokeless tobacco: Never  Substance Use Topics   Alcohol use: No   Drug use: No    Relationship status: married Patient lives with her husband.   Patient is not employed. Regular exercise: Yes: works out 2-3x/week History of abuse: No  Family History:   Family History  Problem Relation Age of Onset   Hypertension Mother    Hypertension Sister    Hypertension Brother    Hypertension Sister    Hypertension Brother      Review of Systems: Review of Systems  Constitutional:  Negative for fever, malaise/fatigue and weight loss.       Weight gain  Respiratory:  Negative for cough, shortness of breath and wheezing.   Cardiovascular:  Negative for chest pain, palpitations and leg swelling.  Gastrointestinal:  Negative for abdominal pain and blood in stool.  Genitourinary:  Positive for dysuria, frequency and urgency. Negative for flank pain and hematuria.  Skin:  Negative for rash.  Neurological:  Negative for dizziness, weakness and headaches.  Endo/Heme/Allergies:  Does not bruise/bleed easily.       Hot flashes  Psychiatric/Behavioral:  Negative for depression. The patient is not nervous/anxious.      OBJECTIVE Physical Exam: There were no vitals filed for this visit.  Physical Exam Constitutional:      General: She is not in acute  distress.    Appearance: Normal appearance.  Genitourinary:     Bladder and urethral meatus normal.     No lesions in the vagina.     Right Labia: No rash, tenderness, lesions, skin changes or Bartholin's cyst.    Left Labia: No tenderness, lesions, skin changes, Bartholin's cyst or rash.    No vaginal discharge, erythema, tenderness, bleeding, ulceration or granulation tissue.      Right Adnexa: not tender, not full and no mass present.    Left Adnexa: not tender, not full and no mass present.    No cervical motion tenderness, discharge, friability, lesion, polyp or nabothian cyst.     Uterus is not enlarged, fixed, tender or irregular.     No uterine mass detected.    Urethral meatus caruncle  not present.    No urethral prolapse, tenderness, mass, hypermobility or discharge present.     Bladder is not tender, urgency on palpation not present and masses not present.      Levator ani not tender, obturator internus not tender, no asymmetrical contractions present and no pelvic spasms present.    Anal wink present and BC reflex present. Cardiovascular:     Rate and Rhythm: Normal rate.  Pulmonary:     Effort: Pulmonary effort is normal. No respiratory distress.  Abdominal:     General: There is no distension.     Palpations: There is no mass.     Tenderness: There is no abdominal tenderness.     Hernia: No hernia is present.  Neurological:     Mental Status: She is alert.  Vitals reviewed. Exam conducted with a chaperone present.     POP-Q:   POP-Q                                               Aa                                               Ba                                                 C                                                Gh                                               Pb                                               tvl                                                Ap                                               Bp                                                  D      Rectal Exam:  Normal sphincter tone, {rectocele:24766} distal rectocele, enterocoele {DESC;  PRESENT/NOT PRESENT:21021351}, no rectal masses, {sign of:24767} dyssynergia when asking the patient to bear down.  Post-Void Residual (PVR) by Bladder Scan: In order to evaluate bladder emptying, we discussed obtaining a postvoid residual and patient agreed to this procedure.  Procedure: The ultrasound unit was placed on the patient's abdomen in the suprapubic region after the patient had voided.      Laboratory Results: Lab Results  Component Value Date   COLORU yellow 03/23/2021   CLARITYU hazy (A) 03/23/2021   GLUCOSEUR negative 03/23/2021   BILIRUBINUR NEGATIVE 05/13/2022   KETONESU neg 10/03/2011   SPECGRAV 1.015 03/23/2021   RBCUR moderate (A) 03/23/2021   PHUR 7.0 03/23/2021   PROTEINUR NEGATIVE 05/13/2022   UROBILINOGEN 0.2 03/23/2021   LEUKOCYTESUR NEGATIVE 05/13/2022    Lab Results  Component Value Date   CREATININE 0.89 03/28/2022   CREATININE 0.89 12/26/2021   CREATININE 0.81 12/04/2007    No results found for: "HGBA1C"  Lab Results  Component Value Date   HGB 11.6 (L) 05/13/2022     ASSESSMENT AND PLAN Ms. Trillo is a 62 y.o. with: No diagnosis found.  There are no diagnoses linked to this encounter.  Time spent: I spent *** minutes dedicated to the care of this patient on the date of this encounter to include pre-visit review of records, face-to-face time with the patient discussing *** and post visit documentation and ordering medication/ testing.    Loleta Chance, MD

## 2023-04-01 ENCOUNTER — Encounter: Payer: Self-pay | Admitting: Obstetrics

## 2023-04-01 ENCOUNTER — Ambulatory Visit (INDEPENDENT_AMBULATORY_CARE_PROVIDER_SITE_OTHER): Payer: 59 | Admitting: Obstetrics

## 2023-04-01 VITALS — BP 122/74 | HR 63 | Ht 66.0 in | Wt 139.0 lb

## 2023-04-01 DIAGNOSIS — R159 Full incontinence of feces: Secondary | ICD-10-CM

## 2023-04-01 DIAGNOSIS — R3914 Feeling of incomplete bladder emptying: Secondary | ICD-10-CM

## 2023-04-01 DIAGNOSIS — N3946 Mixed incontinence: Secondary | ICD-10-CM | POA: Insufficient documentation

## 2023-04-01 DIAGNOSIS — R339 Retention of urine, unspecified: Secondary | ICD-10-CM

## 2023-04-01 DIAGNOSIS — Z87440 Personal history of urinary (tract) infections: Secondary | ICD-10-CM

## 2023-04-01 DIAGNOSIS — Z9889 Other specified postprocedural states: Secondary | ICD-10-CM

## 2023-04-01 LAB — POCT URINALYSIS DIPSTICK
Bilirubin, UA: NEGATIVE
Blood, UA: NEGATIVE
Glucose, UA: NEGATIVE
Ketones, UA: NEGATIVE
Nitrite, UA: NEGATIVE
Protein, UA: NEGATIVE
Spec Grav, UA: <=1.005 — AB (ref 1.010–1.025)
Urobilinogen, UA: 0.2 U/dL
pH, UA: 6 (ref 5.0–8.0)

## 2023-04-01 MED ORDER — GEMTESA 75 MG PO TABS: 75.0000 mg | ORAL_TABLET | Freq: Every day | ORAL | Status: DC

## 2023-04-01 MED ORDER — GEMTESA 75 MG PO TABS: 75.0000 mg | ORAL_TABLET | Freq: Every day | ORAL | 2 refills | Status: DC

## 2023-04-01 NOTE — Assessment & Plan Note (Signed)
-   We discussed the symptoms of overactive bladder (OAB), which include urinary urgency, urinary frequency, nocturia, with or without urge incontinence.  While we do not know the exact etiology of OAB, several treatment options exist. We discussed management including behavioral therapy (decreasing bladder irritants, urge suppression strategies, timed voids, bladder retraining), physical therapy, medication; for refractory cases posterior tibial nerve stimulation, sacral neuromodulation, and intravesical botulinum toxin injection.  For anticholinergic medications, we discussed the potential side effects of anticholinergics including dry eyes, dry mouth, constipation, cognitive impairment and urinary retention. For Beta-3 agonist medication, we discussed the potential side effect of elevated blood pressure which is more likely to occur in individuals with uncontrolled hypertension. - PFPT in 2023 with relief - provided Gemtesa samples to try, pt desires trial however reports minimal bother from symptoms - reviewed risks and benefits of Botox injections if relief from medications but does not desire to continue oral medications long term. Patient to message via Mychart if she desires to proceed  For treatment of stress urinary incontinence,  non-surgical options include expectant management, weight loss, physical therapy, as well as a pessary.  Surgical options include a midurethral sling, Burch urethropexy, and transurethral injection of a bulking agent. - h/o retropubic midurethral sling with recurrent symptoms - discussed possible periurethral bulking if surgical intervention require for midurethral sling

## 2023-04-01 NOTE — Assessment & Plan Note (Signed)
Accidental Bowel Leakage:  - Treatment options include anti-diarrhea medication (loperamide/ Imodium OTC or prescription lomotil), fiber supplements, physical therapy, and possible sacral neuromodulation or surgery.   - s/p PFPT, started fiber supplementation and encouraged to titrate as needed

## 2023-04-01 NOTE — Patient Instructions (Addendum)
We discussed the symptoms of overactive bladder (OAB), which include urinary urgency, urinary frequency, night-time urination, with or without urge incontinence.  We discussed management including behavioral therapy (decreasing bladder irritants by following a bladder diet, urge suppression strategies, timed voids, bladder retraining), physical therapy, medication; and for refractory cases posterior tibial nerve stimulation, sacral neuromodulation, and intravesical botulinum toxin injection.   For Beta-3 agonist medication, we discussed the potential side effect of elevated blood pressure which is more likely to occur in individuals with uncontrolled hypertension. You were given samples for Gemtesa 75mg .  It can take a month to start working so give it time, but if you have bothersome side effects call sooner and we can try a different medication.  Call us if you have trouble filling the prescription or if it's not covered by your insurance.  Start vaginal estrogen therapy nightly for two weeks then 2 times weekly at night for treatment of vaginal atrophy (dryness of the vaginal tissues).  Please let us know if the prescription is too expensive and we can look for alternative options.    For refractory OAB we reviewed the procedure for intravesical Botox injection with cystoscopy in the office and reviewed the risks, benefits and alternatives of treatment including but not limited to infection, need for self-catheterization and need for repeat therapy.  We discussed that there is a 5-15% chance of needing to catheterize with Botox and that this usually resolves in a few months; however can persist for longer periods of time.  Typically Botox injections would need to be repeated every 3-12 months since this is not a permanent therapy.   - For refractory OAB we reviewed the procedure for intravesical Botox injection with cystoscopy in the office and reviewed the risks, benefits and alternatives of treatment  including but not limited to infection, need for self-catheterization and need for repeat therapy.   - We discussed that there is a 5-15% chance of needing to catheterize with Botox and that this usually resolves in a few months; however can persist for longer periods of time.   - Typically Botox injections would need to be repeated every 3-12 months since this is not a permanent therapy. She will return for the procedure.    Women should try to eat at least 21 to 25 grams of fiber a day, while men should aim for 30 to 38 grams a day. You can add fiber to your diet with food or a fiber supplement such as psyllium (metamucil), benefiber, or fibercon.   Here's a look at how much dietary fiber is found in some common foods. When buying packaged foods, check the Nutrition Facts label for fiber content. It can vary among brands.  Fruits Serving size Total fiber (grams)*  Raspberries 1 cup 8.0  Pear 1 medium 5.5  Apple, with skin 1 medium 4.5  Banana 1 medium 3.0  Orange 1 medium 3.0  Strawberries 1 cup 3.0   Vegetables Serving size Total fiber (grams)*  Green peas, boiled 1 cup 9.0  Broccoli, boiled 1 cup chopped 5.0  Turnip greens, boiled 1 cup 5.0  Brussels sprouts, boiled 1 cup 4.0  Potato, with skin, baked 1 medium 4.0  Sweet corn, boiled 1 cup 3.5  Cauliflower, raw 1 cup chopped 2.0  Carrot, raw 1 medium 1.5   Grains Serving size Total fiber (grams)*  Spaghetti, whole-wheat, cooked 1 cup 6.0  Barley, pearled, cooked 1 cup 6.0  Bran flakes 3/4 cup 5.5  Quinoa, cooked 1 cup  5.0  Oat bran muffin 1 medium 5.0  Oatmeal, instant, cooked 1 cup 5.0  Popcorn, air-popped 3 cups 3.5  Brown rice, cooked 1 cup 3.5  Bread, whole-wheat 1 slice 2.0  Bread, rye 1 slice 2.0   Legumes, nuts and seeds Serving size Total fiber (grams)*  Split peas, boiled 1 cup 16.0  Lentils, boiled 1 cup 15.5  Black beans, boiled 1 cup 15.0  Baked beans, canned 1 cup 10.0  Chia seeds 1 ounce 10.0  Almonds 1  ounce (23 nuts) 3.5  Pistachios 1 ounce (49 nuts) 3.0  Sunflower kernels 1 ounce 3.0  *Rounded to nearest 0.5 gram. Source: [employer] for [employer], Legacy Release    Please return for your cystoscopy.

## 2023-04-01 NOTE — Assessment & Plan Note (Signed)
-   no mesh erosion noted on vaginal exam - return for cystoscopy due to history of rUTI

## 2023-04-01 NOTE — Assessment & Plan Note (Addendum)
-   denies UTI symptoms today, POCT UA + trace leuk - For treatment of recurrent urinary tract infections, we discussed management of recurrent UTIs including prophylaxis with a daily low dose antibiotic, transvaginal estrogen therapy, D-mannose, and cranberry supplements.  We discussed the role of diagnostic testing such as cystoscopy and upper tract imaging.   - increase vaginal estrogen dose to dosing dose for 2 weeks nightly and decrease to 2x/week - continue [place]

## 2023-04-01 NOTE — Assessment & Plan Note (Addendum)
-   bladder scan 3mL, repeat at follow-up after starting Gemtesa - consider urodynamics if persistent symptoms due to history of retropubic sling - possibly due to pelvic floor myofascial pain noted on exam bilaterally - encouraged pelvic floor relaxation and optimization of bowel consistency

## 2023-05-05 ENCOUNTER — Ambulatory Visit
Admission: RE | Admit: 2023-05-05 | Discharge: 2023-05-05 | Disposition: A | Discharge status: Home / Self Care | Payer: POS | Source: Ambulatory Visit | Attending: Obstetrics and Gynecology | Admitting: Obstetrics and Gynecology

## 2023-05-05 ENCOUNTER — Ambulatory Visit
Admission: RE | Admit: 2023-05-05 | Discharge: 2023-05-05 | Disposition: A | Discharge status: Home / Self Care | Payer: 59 | Source: Ambulatory Visit | Attending: Obstetrics and Gynecology | Admitting: Obstetrics and Gynecology

## 2023-05-05 DIAGNOSIS — N6310 Unspecified lump in the right breast, unspecified quadrant: Secondary | ICD-10-CM

## 2023-05-22 ENCOUNTER — Encounter: Payer: Self-pay | Admitting: Obstetrics

## 2023-05-22 ENCOUNTER — Ambulatory Visit: Payer: 59 | Admitting: Obstetrics

## 2023-05-22 VITALS — BP 106/64 | HR 71

## 2023-05-22 DIAGNOSIS — Z9889 Other specified postprocedural states: Secondary | ICD-10-CM | POA: Diagnosis not present

## 2023-05-22 DIAGNOSIS — R350 Frequency of micturition: Secondary | ICD-10-CM

## 2023-05-22 DIAGNOSIS — Z87440 Personal history of urinary (tract) infections: Secondary | ICD-10-CM

## 2023-05-22 DIAGNOSIS — R3914 Feeling of incomplete bladder emptying: Secondary | ICD-10-CM | POA: Diagnosis not present

## 2023-05-22 LAB — POCT URINALYSIS DIPSTICK
Bilirubin, UA: NEGATIVE
Blood, UA: NEGATIVE
Glucose, UA: NEGATIVE
Ketones, UA: NEGATIVE
Leukocytes, UA: NEGATIVE
Nitrite, UA: NEGATIVE
Protein, UA: NEGATIVE
Spec Grav, UA: 1.02 (ref 1.010–1.025)
Urobilinogen, UA: 0.2 U/dL
pH, UA: 5.5 (ref 5.0–8.0)

## 2023-05-22 MED ORDER — LIDOCAINE HCL URETHRAL/MUCOSAL 2 % EX GEL
1.0000 | Freq: Once | CUTANEOUS | Status: AC
  Administered 2023-05-22: 1 via URETHRAL

## 2023-05-22 NOTE — Addendum Note (Signed)
Addended byWyatt Haste T on: 05/22/2023 09:33 AM   Modules accepted: Orders

## 2023-05-22 NOTE — Progress Notes (Addendum)
CYSTOSCOPY  CC:  This is a 62 y.o. with recurrent UTI and a history of midurethral sling  who presents today for cystoscopy.  @ENCLABS @  BP 106/64   Pulse 71   CYSTOSCOPY: A time out was performed.  The periurethral area was prepped and draped in a sterile manner.  2% lidocaine jetpack was inserted at the urethral meatus and the urethra and bladder visualized with 12- and 70-degree scopes.  She had normal urethral coaptation and normal urethral mucosa.  She had normal bladder mucosa. She had bilateral clear efflux from both ureteral orifices.  She had no squamous metaplasia at the trigone, no trabeculations, cellules or diverticuli.    Results for orders placed or performed in visit on 05/22/23  POCT Urinalysis Dipstick   Collection Time: 05/22/23 12:13 PM  Result Value Ref Range   Color, UA Yellow    Clarity, UA Clear    Glucose, UA Negative Negative   Bilirubin, UA Negative    Ketones, UA Negative    Spec Grav, UA 1.020 1.010 - 1.025   Blood, UA Negative    pH, UA 5.5 5.0 - 8.0   Protein, UA Negative Negative   Urobilinogen, UA 0.2 0.2 or 1.0 E.U./dL   Nitrite, UA Negative    Leukocytes, UA Negative Negative   Appearance     Odor       ASSESSMENT:  62 y.o. with recurrent UTI and history of midurethral sling . Cystoscopy today is normal with no foreign material noted in the bladder or urethra.  PLAN:  Follow-up to discuss findings and treatment options.  All questions answered and post-procedures instructions were given - consider UDS if she continues to report sensation of incomplete emptying - encouraged to continue low dose vaginal estrogen 2x/week, can increase to 3x/week.  - encouraged probiotics - encouraged to consider returning to pelvic floor PT due to prior relief of pelvic floor symptoms. - follow-up in 4 weeks  Loleta Chance, MD

## 2023-05-22 NOTE — Addendum Note (Signed)
Addended by: Salina April on: 05/22/2023 12:10 PM   Modules accepted: Orders

## 2023-05-22 NOTE — Addendum Note (Signed)
Addended byWyatt Haste T on: 05/22/2023 08:50 AM   Modules accepted: Level of Service

## 2023-05-22 NOTE — Patient Instructions (Addendum)
Taking Care of Yourself after Urodynamics, Cystoscopy, Bulkamid Injection, or Botox Injection   Drink plenty of water for a day or two following your procedure. Try to have about 8 ounces (one cup) at a time, and do this 6 times or more per day unless you have fluid restrictitons AVOID irritative beverages such as coffee, tea, soda, alcoholic or citrus drinks for a day or two, as this may cause burning with urination.  For the first 1-2 days after the procedure, your urine may be pink or red in color. You may have some blood in your urine as a normal side effect of the procedure. Large amounts of bleeding or difficulty urinating are NOT normal. Call the nurse line if this happens or go to the nearest Emergency Room if the bleeding is heavy or you cannot urinate at all and it is after hours. If you had a Bulkamid injection in the urethra and need to be catheterized, ask for a pediatric catheter to be used (size 10 or 12-French) so the material is not pushed out of place.   You may experience some discomfort or a burning sensation with urination after having this procedure. You can use over the counter Azo or pyridium to help with burning and follow the instructions on the packaging. If it does not improve within 1-2 days, or other symptoms appear (fever, chills, or difficulty urinating) call the office to speak to a nurse.  You may return to normal daily activities such as work, school, driving, exercising and housework on the day of the procedure. If your doctor gave you a prescription, take it as ordered.    Continue vaginal estrogen 1g twice a week, you can increase to 3 times a week if you continue to experience vaginal symptoms.  Consider return to pelvic floor PT due to prior relief of pelvic floor symptoms.

## 2023-06-23 ENCOUNTER — Ambulatory Visit (INDEPENDENT_AMBULATORY_CARE_PROVIDER_SITE_OTHER): Payer: Commercial Managed Care - PPO | Admitting: Obstetrics

## 2023-06-23 ENCOUNTER — Encounter: Payer: Self-pay | Admitting: Obstetrics

## 2023-06-23 VITALS — BP 110/69 | HR 67

## 2023-06-23 DIAGNOSIS — Z87440 Personal history of urinary (tract) infections: Secondary | ICD-10-CM | POA: Diagnosis not present

## 2023-06-23 DIAGNOSIS — R3914 Feeling of incomplete bladder emptying: Secondary | ICD-10-CM | POA: Diagnosis not present

## 2023-06-23 MED ORDER — ESTRADIOL 0.1 MG/GM VA CREA: TOPICAL_CREAM | VAGINAL | 3 refills | Status: AC

## 2023-06-23 NOTE — Patient Instructions (Signed)
 Continue vaginal estrogen 1g twice a week.   Monitor for return of vaginal or urinary symptoms.

## 2023-06-23 NOTE — Assessment & Plan Note (Addendum)
-   denies UTI symptoms today, POCT UA + trace leuk on 05/14/23, repeat UA negative 05/22/23 - For treatment of recurrent urinary tract infections, we discussed management of recurrent UTIs including prophylaxis with a daily low dose antibiotic, transvaginal estrogen therapy, D-mannose, and cranberry supplements.  We discussed the role of diagnostic testing such as cystoscopy and upper tract imaging.   - symptomatic relief since increasing vaginal estrogen dose to loading dose, continue maintenance dose 1g 2x/week - Rx provided for estrace  - continue [place] - negative cystoscopy 05/22/23 for foreign material or mesh exposure - pt to call office for evaluation if she experiences any UTI symptoms

## 2023-06-23 NOTE — Progress Notes (Signed)
 [facility] Return Visit  SUBJECTIVE  History of Present Illness: [name] is a 63 y.o. female seen in follow-up for recurrent UTI and history of midurethral sling. Plan at last visit was increase vaginal estrogen to therapeutic dosing.   Negative Cystoscopy 05/22/23 Patient reports doing well.  Continues vaginal estrogen, now able to sit comfortably and reports 100% improvement in symptoms. Resumed pelvic floor PT relaxation exercises with relief of incomplete emptying sensation 50%  Past Medical History: Patient  has a past medical history of Breakthrough bleeding (2001), Fibroids, submucosal, GERD (gastroesophageal reflux disease), H/O carpal tunnel syndrome, H/O fatigue, H/O menorrhagia, thyroid  nodule, UTI (urinary tract infection), Irritable bowel disease, Mastalgia (2010), Postcoital bleeding (2012), Urinary incontinence, mixed (2009), Vaginal atrophy, Varicose veins, and Yeast infection.   Past Surgical History: She  has a past surgical history that includes  S/P bilateral tubal ligation (1998); Thyroidectomy, partial; Novasure ablation (07/14/2009); Dilation and curettage of uterus (07/14/2009); Hysteroscopy (07/14/2009); Tubal ligation (1998); Hammer toe surgery (1996); carpal tunnel repair (right); and Intrauterine device insertion.   Medications: She has a current medication list which includes the following prescription(s): vitamin c, cetirizine, cranberry, docosahexaenoic acid, [START ON 06/26/2023] estradiol , ferrous sulfate, multivitamin, airborne, and probiotic product.   Allergies: Patient is allergic to nitrofurantoin  macrocrystal, octacosanol, and codeine.   Social History: Patient  reports that she has never smoked. She has never used smokeless tobacco. She reports that she does not drink alcohol and does not use drugs.     OBJECTIVE     Physical Exam: Vitals:   06/23/23 1033  BP: 110/69  Pulse: 67   Gen: No apparent distress, A&O x  3.  Detailed Urogynecologic Evaluation:  Deferred. Prior exam showed:      No data to display             ASSESSMENT AND PLAN    [name] is a 63 y.o. with:  1. History of recurrent UTI (urinary tract infection)   2. Feeling of incomplete bladder emptying     History of recurrent UTI (urinary tract infection) Assessment & Plan: - denies UTI symptoms today, POCT UA + trace leuk on 05/14/23, repeat UA negative 05/22/23 - For treatment of recurrent urinary tract infections, we discussed management of recurrent UTIs including prophylaxis with a daily low dose antibiotic, transvaginal estrogen therapy, D-mannose, and cranberry supplements.  We discussed the role of diagnostic testing such as cystoscopy and upper tract imaging.   - symptomatic relief since increasing vaginal estrogen dose to loading dose, continue maintenance dose 1g 2x/week - Rx provided for estrace  - continue Uqora - negative cystoscopy 05/22/23 for foreign material or mesh exposure   Feeling of incomplete bladder emptying Assessment & Plan: - reports 50% improvement after pelvic floor PT relaxation exercises and vaginal estrogen at therapeutic dosing, now able to sit comfortably - prior bladder scan 3mL, repeat if clinical change - consider urodynamics if persistent symptoms due to history of retropubic sling, will postpone at this time due to clinical improvement  - possibly due to pelvic floor myofascial pain noted on exam bilaterally, encouraged pt to return to pelvic floor PT if she continue to experience symptoms - encouraged to continue pelvic floor relaxation exercises and optimization of bowel consistency   Other orders -     Estradiol ; Place 0.5-1g twice a week  Dispense: 30 g; Refill: 3  Time spent: I spent 15 minutes dedicated to the care of this patient on the date of this encounter to  include pre-visit review of records, face-to-face time with the patient discussing recurrent UTI, sensation of  incomplete emptying with history of midurethral sling, and post visit documentation and ordering medication/ testing.    Lianne ONEIDA Gillis, MD

## 2023-06-23 NOTE — Assessment & Plan Note (Signed)
-   reports 50% improvement after pelvic floor PT relaxation exercises and vaginal estrogen at therapeutic dosing, now able to sit comfortably - prior bladder scan 3mL, repeat if clinical change - consider urodynamics if persistent symptoms due to history of retropubic sling, will postpone at this time due to clinical improvement  - possibly due to pelvic floor myofascial pain noted on exam bilaterally, encouraged pt to return to pelvic floor PT if she continue to experience symptoms - encouraged to continue pelvic floor relaxation exercises and optimization of bowel consistency

## 2023-09-09 ENCOUNTER — Ambulatory Visit
Admission: RE | Admit: 2023-09-09 | Discharge: 2023-09-09 | Disposition: A | Discharge status: Home / Self Care | Payer: Commercial Managed Care - PPO | Source: Ambulatory Visit | Attending: Obstetrics and Gynecology | Admitting: Obstetrics and Gynecology

## 2023-09-09 DIAGNOSIS — M8580 Other specified disorders of bone density and structure, unspecified site: Secondary | ICD-10-CM | POA: Insufficient documentation

## 2023-09-09 DIAGNOSIS — N951 Menopausal and female climacteric states: Secondary | ICD-10-CM

## 2024-03-10 ENCOUNTER — Other Ambulatory Visit: Payer: Self-pay | Admitting: Obstetrics and Gynecology

## 2024-03-10 DIAGNOSIS — N6489 Other specified disorders of breast: Secondary | ICD-10-CM

## 2024-05-05 ENCOUNTER — Ambulatory Visit
Admission: RE | Admit: 2024-05-05 | Discharge: 2024-05-05 | Disposition: A | Discharge status: Home / Self Care | Source: Ambulatory Visit | Attending: Obstetrics and Gynecology | Admitting: Obstetrics and Gynecology

## 2024-05-05 ENCOUNTER — Ambulatory Visit
Admission: RE | Admit: 2024-05-05 | Discharge: 2024-05-05 | Disposition: A | Source: Ambulatory Visit | Attending: Obstetrics and Gynecology | Admitting: Obstetrics and Gynecology

## 2024-05-05 DIAGNOSIS — N6489 Other specified disorders of breast: Secondary | ICD-10-CM

## 2024-05-17 ENCOUNTER — Ambulatory Visit: Admission: EM | Admit: 2024-05-17 | Discharge: 2024-05-17 | Disposition: A | Discharge status: Home / Self Care

## 2024-05-17 ENCOUNTER — Encounter: Payer: Self-pay | Admitting: Emergency Medicine

## 2024-05-17 DIAGNOSIS — D649 Anemia, unspecified: Secondary | ICD-10-CM | POA: Insufficient documentation

## 2024-05-17 DIAGNOSIS — R0982 Postnasal drip: Secondary | ICD-10-CM

## 2024-05-17 DIAGNOSIS — R339 Retention of urine, unspecified: Secondary | ICD-10-CM | POA: Insufficient documentation

## 2024-05-17 DIAGNOSIS — Z85850 Personal history of malignant neoplasm of thyroid: Secondary | ICD-10-CM | POA: Insufficient documentation

## 2024-05-17 DIAGNOSIS — K219 Gastro-esophageal reflux disease without esophagitis: Secondary | ICD-10-CM | POA: Insufficient documentation

## 2024-05-17 DIAGNOSIS — I872 Venous insufficiency (chronic) (peripheral): Secondary | ICD-10-CM | POA: Insufficient documentation

## 2024-05-17 DIAGNOSIS — D72819 Decreased white blood cell count, unspecified: Secondary | ICD-10-CM | POA: Insufficient documentation

## 2024-05-17 DIAGNOSIS — J309 Allergic rhinitis, unspecified: Secondary | ICD-10-CM | POA: Insufficient documentation

## 2024-05-17 MED ORDER — PSEUDOEPHEDRINE HCL 30 MG PO TABS: 30.0000 mg | ORAL_TABLET | ORAL | 0 refills | Status: AC | PRN

## 2024-05-17 NOTE — ED Triage Notes (Signed)
 Pt presents c/o sinus concern x several week. Pt states,  About 4 weeks ago I got a flu shot and I felt like I got a little sick afterwards. That went away but I kept getting the cough from the sinus drainage. It's like when I go to sleep everything is draining and gets in my throat causing me to choke in my sleep.  Pt denies any additional sxs. Pt has tried 12 hour decongestant thus far with no relief.

## 2024-05-17 NOTE — ED Provider Notes (Signed)
 [facility]    CSN: [id number] Arrival date & time: 05/17/24  1045      History   Chief Complaint Chief Complaint  Patient presents with   Facial Pain    HPI [name] is a 63 y.o. female.   Pt presents today due to 4 weeks of post nasal drip. Pt states that she has been using flonase and Claritin-D with no significant relief. Pt denies sinus pain, sinus pressure, or headache. Pt states that post nasal drip is clear when she is able to produce it with cough.   The history is provided by the patient.    Past Medical History:  Diagnosis Date   Breakthrough bleeding 2001   Fibroids, submucosal    GERD (gastroesophageal reflux disease)    H/O carpal tunnel syndrome    H/O fatigue    H/O menorrhagia    Hx of thyroid  nodule    Hx: UTI (urinary tract infection)    Irritable bowel disease    Mastalgia 2010   Postcoital bleeding 2012   Urinary incontinence, mixed 2009   Vaginal atrophy    Varicose veins    Yeast infection     Patient Active Problem List   Diagnosis Date Noted   Allergic rhinitis 05/17/2024   Anemia 05/17/2024   Gastroesophageal reflux disease 05/17/2024   History of malignant neoplasm of thyroid  05/17/2024   Incomplete emptying of bladder 05/17/2024   Leukopenia 05/17/2024   Peripheral venous insufficiency 05/17/2024   Osteopenia 09/09/2023   History of recurrent UTI (urinary tract infection) 04/01/2023   Mixed stress and urge urinary incontinence 04/01/2023   Feeling of incomplete bladder emptying 04/01/2023   Incontinence of feces 04/01/2023   Dysuria 05/13/2022   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   Mass of right breast on mammogram 02/25/2022   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   Skin inflammation 12/07/2019   Atrophy of vagina 11/27/2017   Hemangioma of liver 03/31/2017   History of subtotal thyroidectomy 11/23/2016   Varicose veins of bilateral lower extremities with other complications 10/04/2013   Fibroids 09/23/2011    S/P endometrial ablation 09/23/2011   IUD 09/23/2011   H/O bladder repair surgery 09/23/2011   Hx of seasonal allergies 07/24/2011    Past Surgical History:  Procedure Laterality Date    S/P bilateral tubal ligation  1998   carpal tunnel repair  right   DILATION AND CURETTAGE OF UTERUS  07/14/2009   HAMMER TOE SURGERY  1996   HYSTEROSCOPY  07/14/2009   INTRAUTERINE DEVICE INSERTION     NOVASURE ABLATION  07/14/2009   THYROIDECTOMY, PARTIAL     TUBAL LIGATION  1998   bilateral    OB History     Gravida  3   Para  2   Term  2   Preterm      AB  1   Living  2      SAB      IAB      Ectopic      Multiple      Live Births  2            Home Medications    Prior to Admission medications   Medication Sig Start Date End Date Taking? Authorizing Provider  acetaminophen (TYLENOL) 325 MG tablet Take 650 mg by mouth every 6 (six) hours as needed.   Yes [provider]  Flaxseed, Linseed, (FLAXSEED OIL PO) Take by mouth. 01/27/17  Yes [provider]  Omega-3 350 MG  CPDR Take by mouth. 01/27/17  Yes [provider]  Omega-3 Fatty Acids (FISH OIL) 1000 MG CAPS Take by mouth. 12/07/19  Yes [provider]  Probiotic, Lactobacillus, CAPS Take by mouth. 01/27/17  Yes [provider]  pseudoephedrine  (SUDAFED) 30 MG tablet Take 1 tablet (30 mg total) by mouth every 4 (four) hours as needed for congestion. 05/17/24  Yes Andra Corean BROCKS, PA-C  Ascorbic Acid (VITAMIN C) 100 MG tablet Take 100 mg by mouth daily.    [provider]  cetirizine (ZYRTEC) 10 MG tablet Take 10 mg by mouth daily.    [provider]  Cholecalciferol 50 MCG (2000 UT) CAPS 1 capsule.    [provider]  Cranberry 500 MG CAPS Take by mouth.    [provider]  Docosahexaenoic Acid (DHA OMEGA 3 PO) Take by mouth.    [provider]  estradiol  (ESTRACE ) 0.1 MG/GM vaginal cream Place 0.5-1g twice a week 06/26/23    Yuen, Hoi T, MD  ferrous sulfate 325 (65 FE) MG tablet Take 325 mg by mouth daily with breakfast.    [provider]  Multiple Vitamin (MULTIVITAMIN) tablet Take 1 tablet by mouth daily.    [provider]  Multiple Vitamins-Minerals (AIRBORNE) CHEW See admin instructions.    [provider]  Probiotic Product (PROBIOTIC DAILY PO) Take by mouth.    [provider]    Family History Family History  Problem Relation Age of Onset   Hypertension Mother    Hypertension Sister    Hypertension Sister    Hypertension Brother    Hypertension Brother     Social History Social History   Tobacco Use   Smoking status: Never    Passive exposure: Never   Smokeless tobacco: Never  Vaping Use   Vaping status: Never Used  Substance Use Topics   Alcohol use: No   Drug use: No     Allergies   Nitrofurantoin  macrocrystal, Octacosanol, Other, and Codeine   Review of Systems Review of Systems   Physical Exam Triage Vital Signs ED Triage Vitals  Encounter Vitals Group     BP 05/17/24 1125 123/86     Girls Systolic BP Percentile --      Girls Diastolic BP Percentile --      Boys Systolic BP Percentile --      Boys Diastolic BP Percentile --      Pulse Rate 05/17/24 1125 76     Resp 05/17/24 1125 16     Temp 05/17/24 1125 98 F (36.7 C)     Temp Source 05/17/24 1125 Oral     SpO2 05/17/24 1125 97 %     Weight 05/17/24 1125 136 lb 11 oz (62 kg)     Height --      Head Circumference --      Peak Flow --      Pain Score 05/17/24 1123 0     Pain Loc --      Pain Education --      Exclude from Growth Chart --    No data found.  Updated Vital Signs BP 123/86 (BP Location: Left Arm)   Pulse 76   Temp 98 F (36.7 C) (Oral)   Resp 16   Wt 136 lb 11 oz (62 kg)   LMP  (LMP Unknown)   SpO2 97%   BMI 22.06 kg/m   Visual Acuity Right Eye Distance:   Left Eye Distance:   Bilateral Distance:  Right Eye Near:   Left Eye Near:     Bilateral Near:     Physical Exam Vitals and nursing note reviewed.  Constitutional:      General: She is not in acute distress.    Appearance: Normal appearance. She is not ill-appearing, toxic-appearing or diaphoretic.  HENT:     Nose: Congestion (moderately enlarged turbinates) present. No rhinorrhea.     Right Sinus: No maxillary sinus tenderness or frontal sinus tenderness.     Left Sinus: No maxillary sinus tenderness or frontal sinus tenderness.     Comments: No tenderness to palpation of upper lip    Mouth/Throat:     Mouth: Mucous membranes are moist.     Pharynx: Oropharynx is clear. No oropharyngeal exudate or posterior oropharyngeal erythema.  Eyes:     General: No scleral icterus. Cardiovascular:     Rate and Rhythm: Normal rate and regular rhythm.     Heart sounds: Normal heart sounds.  Pulmonary:     Effort: Pulmonary effort is normal. No respiratory distress.     Breath sounds: Normal breath sounds. No wheezing or rhonchi.  Skin:    General: Skin is warm.  Neurological:     Mental Status: She is alert and oriented to person, place, and time.  Psychiatric:        Mood and Affect: Mood normal.        Behavior: Behavior normal.      UC Treatments / Results  Labs (all labs ordered are listed, but only abnormal results are displayed) Labs Reviewed - No data to display  EKG   Radiology No results found.  Procedures Procedures (including critical care time)  Medications Ordered in UC Medications - No data to display  Initial Impression / Assessment and Plan / UC Course  I have reviewed the triage vital signs and the nursing notes.  Pertinent labs & imaging results that were available during my care of the patient were reviewed by me and considered in my medical decision making (see chart for details).    Final Clinical Impressions(s) / UC Diagnoses   Final diagnoses:  Post-nasal drip   Discharge Instructions   None    ED Prescriptions      Medication Sig Dispense Auth. Provider   pseudoephedrine  (SUDAFED) 30 MG tablet Take 1 tablet (30 mg total) by mouth every 4 (four) hours as needed for congestion. 30 tablet Andra Corean BROCKS, PA-C      PDMP not reviewed this encounter.   Andra Corean BROCKS, PA-C 05/17/24 1620
